# Patient Record
Sex: Female | Born: 1985 | Race: White | Hispanic: No | Marital: Single | Smoking: Never smoker
Health system: Southern US, Community
[De-identification: ages and names within clinical notes are randomized; demographics above are authoritative.]

## PROBLEM LIST (undated history)

## (undated) DIAGNOSIS — K219 Gastro-esophageal reflux disease without esophagitis: Secondary | ICD-10-CM

## (undated) DIAGNOSIS — M549 Dorsalgia, unspecified: Secondary | ICD-10-CM

## (undated) DIAGNOSIS — Z8614 Personal history of Methicillin resistant Staphylococcus aureus infection: Secondary | ICD-10-CM

## (undated) DIAGNOSIS — R233 Spontaneous ecchymoses: Secondary | ICD-10-CM

## (undated) DIAGNOSIS — M797 Fibromyalgia: Secondary | ICD-10-CM

## (undated) DIAGNOSIS — S199XXA Unspecified injury of neck, initial encounter: Secondary | ICD-10-CM

## (undated) DIAGNOSIS — F909 Attention-deficit hyperactivity disorder, unspecified type: Secondary | ICD-10-CM

## (undated) DIAGNOSIS — M5136 Other intervertebral disc degeneration, lumbar region: Secondary | ICD-10-CM

## (undated) DIAGNOSIS — M1990 Unspecified osteoarthritis, unspecified site: Secondary | ICD-10-CM

## (undated) DIAGNOSIS — F32A Depression, unspecified: Secondary | ICD-10-CM

## (undated) DIAGNOSIS — S069XAA Unspecified intracranial injury with loss of consciousness status unknown, initial encounter: Secondary | ICD-10-CM

## (undated) DIAGNOSIS — G8929 Other chronic pain: Secondary | ICD-10-CM

## (undated) DIAGNOSIS — F419 Anxiety disorder, unspecified: Secondary | ICD-10-CM

## (undated) DIAGNOSIS — R238 Other skin changes: Secondary | ICD-10-CM

## (undated) DIAGNOSIS — F4310 Post-traumatic stress disorder, unspecified: Secondary | ICD-10-CM

## (undated) DIAGNOSIS — R519 Headache, unspecified: Secondary | ICD-10-CM

## (undated) DIAGNOSIS — M51369 Other intervertebral disc degeneration, lumbar region without mention of lumbar back pain or lower extremity pain: Secondary | ICD-10-CM

## (undated) DIAGNOSIS — R51 Headache: Secondary | ICD-10-CM

## (undated) DIAGNOSIS — M5126 Other intervertebral disc displacement, lumbar region: Secondary | ICD-10-CM

## (undated) DIAGNOSIS — F19959 Other psychoactive substance use, unspecified with psychoactive substance-induced psychotic disorder, unspecified: Secondary | ICD-10-CM

## (undated) DIAGNOSIS — N809 Endometriosis, unspecified: Secondary | ICD-10-CM

## (undated) DIAGNOSIS — R569 Unspecified convulsions: Secondary | ICD-10-CM

## (undated) DIAGNOSIS — F1510 Other stimulant abuse, uncomplicated: Secondary | ICD-10-CM

## (undated) DIAGNOSIS — F329 Major depressive disorder, single episode, unspecified: Secondary | ICD-10-CM

## (undated) HISTORY — PX: BACK SURGERY: SHX140

## (undated) HISTORY — PX: EXPLORATORY LAPAROTOMY: SUR591

## (undated) HISTORY — PX: ABDOMINAL HYSTERECTOMY: SHX81

## (undated) HISTORY — PX: TONSILLECTOMY: SUR1361

## (undated) HISTORY — PX: APPENDECTOMY: SHX54

---

## 2004-03-04 ENCOUNTER — Emergency Department: Payer: Self-pay | Admitting: General Practice

## 2004-03-05 ENCOUNTER — Ambulatory Visit: Payer: Self-pay

## 2004-03-07 ENCOUNTER — Ambulatory Visit: Payer: Self-pay

## 2004-03-13 ENCOUNTER — Ambulatory Visit: Payer: Self-pay

## 2004-03-25 ENCOUNTER — Ambulatory Visit: Payer: Self-pay

## 2004-03-26 ENCOUNTER — Ambulatory Visit: Payer: Self-pay

## 2004-05-08 ENCOUNTER — Ambulatory Visit: Payer: Self-pay

## 2004-06-24 ENCOUNTER — Emergency Department: Payer: Self-pay | Admitting: Emergency Medicine

## 2004-08-28 ENCOUNTER — Emergency Department: Payer: Self-pay | Admitting: Emergency Medicine

## 2004-09-10 ENCOUNTER — Inpatient Hospital Stay: Payer: Self-pay

## 2004-10-14 ENCOUNTER — Emergency Department: Payer: Self-pay | Admitting: Emergency Medicine

## 2004-10-27 ENCOUNTER — Emergency Department: Payer: Self-pay | Admitting: Emergency Medicine

## 2005-01-11 ENCOUNTER — Observation Stay: Payer: Self-pay

## 2005-02-10 ENCOUNTER — Observation Stay: Payer: Self-pay | Admitting: Obstetrics & Gynecology

## 2005-02-18 ENCOUNTER — Observation Stay: Payer: Self-pay | Admitting: Unknown Physician Specialty

## 2005-03-03 ENCOUNTER — Observation Stay: Payer: Self-pay | Admitting: Unknown Physician Specialty

## 2005-03-03 ENCOUNTER — Ambulatory Visit: Payer: Self-pay | Admitting: Family Medicine

## 2005-03-03 ENCOUNTER — Inpatient Hospital Stay (HOSPITAL_COMMUNITY): Admission: AD | Admit: 2005-03-03 | Discharge: 2005-03-06 | Payer: Self-pay | Admitting: Family Medicine

## 2005-03-07 ENCOUNTER — Observation Stay: Payer: Self-pay | Admitting: Obstetrics & Gynecology

## 2005-03-10 ENCOUNTER — Observation Stay: Payer: Self-pay

## 2005-03-12 ENCOUNTER — Observation Stay: Payer: Self-pay

## 2005-03-21 ENCOUNTER — Observation Stay: Payer: Self-pay

## 2005-03-22 ENCOUNTER — Inpatient Hospital Stay: Payer: Self-pay | Admitting: Unknown Physician Specialty

## 2005-04-03 ENCOUNTER — Observation Stay: Payer: Self-pay | Admitting: Unknown Physician Specialty

## 2005-04-09 ENCOUNTER — Observation Stay: Payer: Self-pay | Admitting: Obstetrics & Gynecology

## 2005-04-10 ENCOUNTER — Inpatient Hospital Stay: Payer: Self-pay | Admitting: Unknown Physician Specialty

## 2005-04-15 ENCOUNTER — Emergency Department: Payer: Self-pay | Admitting: Internal Medicine

## 2005-06-25 ENCOUNTER — Emergency Department: Payer: Self-pay | Admitting: Emergency Medicine

## 2006-05-27 ENCOUNTER — Ambulatory Visit: Payer: Self-pay

## 2006-05-27 ENCOUNTER — Observation Stay: Payer: Self-pay

## 2006-05-31 ENCOUNTER — Observation Stay: Payer: Self-pay | Admitting: Unknown Physician Specialty

## 2006-06-07 ENCOUNTER — Observation Stay: Payer: Self-pay | Admitting: Obstetrics & Gynecology

## 2006-06-20 ENCOUNTER — Observation Stay: Payer: Self-pay | Admitting: Unknown Physician Specialty

## 2006-06-30 ENCOUNTER — Observation Stay: Payer: Self-pay | Admitting: Unknown Physician Specialty

## 2006-07-21 ENCOUNTER — Observation Stay: Payer: Self-pay | Admitting: Unknown Physician Specialty

## 2006-07-23 ENCOUNTER — Observation Stay: Payer: Self-pay | Admitting: Obstetrics & Gynecology

## 2006-07-24 ENCOUNTER — Observation Stay: Payer: Self-pay

## 2006-08-01 ENCOUNTER — Inpatient Hospital Stay: Payer: Self-pay

## 2007-05-20 ENCOUNTER — Emergency Department: Payer: Self-pay | Admitting: Emergency Medicine

## 2007-07-11 ENCOUNTER — Emergency Department: Payer: Self-pay | Admitting: Emergency Medicine

## 2007-07-14 ENCOUNTER — Ambulatory Visit: Payer: Self-pay

## 2007-07-20 ENCOUNTER — Ambulatory Visit: Payer: Self-pay

## 2007-09-21 ENCOUNTER — Emergency Department: Payer: Self-pay | Admitting: Emergency Medicine

## 2007-10-10 ENCOUNTER — Inpatient Hospital Stay: Payer: Self-pay | Admitting: Unknown Physician Specialty

## 2007-10-10 ENCOUNTER — Other Ambulatory Visit: Payer: Self-pay

## 2007-12-28 ENCOUNTER — Emergency Department: Payer: Self-pay | Admitting: Emergency Medicine

## 2008-03-27 ENCOUNTER — Ambulatory Visit: Payer: Self-pay

## 2009-03-28 ENCOUNTER — Emergency Department: Payer: Self-pay | Admitting: Emergency Medicine

## 2009-04-08 ENCOUNTER — Emergency Department: Payer: Self-pay | Admitting: Emergency Medicine

## 2009-10-26 ENCOUNTER — Ambulatory Visit: Payer: Self-pay | Admitting: Family Medicine

## 2009-10-30 ENCOUNTER — Ambulatory Visit: Payer: Self-pay | Admitting: Internal Medicine

## 2009-11-21 ENCOUNTER — Emergency Department: Payer: Self-pay | Admitting: Emergency Medicine

## 2009-12-10 ENCOUNTER — Ambulatory Visit: Payer: Self-pay | Admitting: Family Medicine

## 2010-05-13 ENCOUNTER — Inpatient Hospital Stay: Payer: Self-pay | Admitting: Internal Medicine

## 2010-06-22 ENCOUNTER — Emergency Department: Payer: Self-pay | Admitting: Emergency Medicine

## 2010-07-18 ENCOUNTER — Emergency Department: Payer: Self-pay | Admitting: Emergency Medicine

## 2010-08-20 ENCOUNTER — Ambulatory Visit: Payer: Self-pay | Admitting: Internal Medicine

## 2010-08-27 ENCOUNTER — Ambulatory Visit: Payer: Self-pay | Admitting: Internal Medicine

## 2010-09-06 ENCOUNTER — Ambulatory Visit: Payer: Self-pay | Admitting: Internal Medicine

## 2010-11-14 ENCOUNTER — Emergency Department: Payer: Self-pay | Admitting: Emergency Medicine

## 2010-12-19 ENCOUNTER — Emergency Department: Payer: Self-pay | Admitting: Emergency Medicine

## 2010-12-23 ENCOUNTER — Ambulatory Visit: Payer: Self-pay | Admitting: Family Medicine

## 2010-12-23 ENCOUNTER — Ambulatory Visit: Payer: Self-pay | Admitting: Internal Medicine

## 2010-12-29 ENCOUNTER — Ambulatory Visit: Payer: Self-pay | Admitting: Internal Medicine

## 2011-01-13 ENCOUNTER — Ambulatory Visit: Payer: Self-pay | Admitting: Family Medicine

## 2011-02-05 ENCOUNTER — Other Ambulatory Visit: Payer: Self-pay | Admitting: Pain Medicine

## 2011-02-05 ENCOUNTER — Ambulatory Visit: Payer: Self-pay | Admitting: Pain Medicine

## 2011-02-06 ENCOUNTER — Ambulatory Visit: Payer: Self-pay | Admitting: Pain Medicine

## 2011-02-06 ENCOUNTER — Emergency Department: Payer: Self-pay | Admitting: Emergency Medicine

## 2011-02-10 ENCOUNTER — Ambulatory Visit: Payer: Self-pay | Admitting: Pain Medicine

## 2011-04-09 ENCOUNTER — Emergency Department: Payer: Self-pay | Admitting: Unknown Physician Specialty

## 2011-08-01 ENCOUNTER — Emergency Department: Payer: Self-pay | Admitting: Emergency Medicine

## 2011-08-13 ENCOUNTER — Ambulatory Visit: Payer: Self-pay | Admitting: Unknown Physician Specialty

## 2011-09-26 ENCOUNTER — Emergency Department: Payer: Self-pay | Admitting: Emergency Medicine

## 2011-09-26 LAB — COMPREHENSIVE METABOLIC PANEL
Albumin: 4.4 g/dL (ref 3.4–5.0)
Alkaline Phosphatase: 54 U/L (ref 50–136)
Anion Gap: 8 (ref 7–16)
BUN: 11 mg/dL (ref 7–18)
Bilirubin,Total: 0.9 mg/dL (ref 0.2–1.0)
Calcium, Total: 8.8 mg/dL (ref 8.5–10.1)
Chloride: 106 mmol/L (ref 98–107)
Co2: 26 mmol/L (ref 21–32)
Creatinine: 0.77 mg/dL (ref 0.60–1.30)
EGFR (African American): >60
EGFR (Non-African Amer.): >60
Glucose: 137 mg/dL — ABNORMAL HIGH (ref 65–99)
Osmolality: 281 (ref 275–301)
Potassium: 3.4 mmol/L — ABNORMAL LOW (ref 3.5–5.1)
SGOT(AST): 18 U/L (ref 15–37)
SGPT (ALT): 13 U/L
Sodium: 140 mmol/L (ref 136–145)
Total Protein: 7.7 g/dL (ref 6.4–8.2)

## 2011-09-26 LAB — CBC
HCT: 41.5 % (ref 35.0–47.0)
HGB: 14.1 g/dL (ref 12.0–16.0)
MCH: 30.6 pg (ref 26.0–34.0)
MCHC: 33.9 g/dL (ref 32.0–36.0)
MCV: 90 fL (ref 80–100)
Platelet: 212 10*3/uL (ref 150–440)
RBC: 4.6 10*6/uL (ref 3.80–5.20)
RDW: 12.7 % (ref 11.5–14.5)
WBC: 5.4 10*3/uL (ref 3.6–11.0)

## 2011-09-26 LAB — URINALYSIS, COMPLETE
Bacteria: NONE SEEN
Bilirubin,UR: NEGATIVE
Blood: NEGATIVE
Glucose,UR: NEGATIVE mg/dL (ref 0–75)
Ketone: NEGATIVE
Nitrite: NEGATIVE
Ph: 6 (ref 4.5–8.0)
Protein: NEGATIVE
RBC,UR: 1 /HPF (ref 0–5)
Specific Gravity: 1.008 (ref 1.003–1.030)
Squamous Epithelial: 18
WBC UR: 8 /HPF (ref 0–5)

## 2011-09-26 LAB — DRUG SCREEN, URINE
Amphetamines, Ur Screen: NEGATIVE (ref ?–1000)
Barbiturates, Ur Screen: NEGATIVE (ref ?–200)
Benzodiazepine, Ur Scrn: POSITIVE (ref ?–200)
Cannabinoid 50 Ng, Ur ~~LOC~~: NEGATIVE (ref ?–50)
Cocaine Metabolite,Ur ~~LOC~~: NEGATIVE (ref ?–300)
MDMA (Ecstasy)Ur Screen: NEGATIVE (ref ?–500)
Methadone, Ur Screen: NEGATIVE (ref ?–300)
Opiate, Ur Screen: POSITIVE (ref ?–300)
Phencyclidine (PCP) Ur S: NEGATIVE (ref ?–25)
Tricyclic, Ur Screen: NEGATIVE (ref ?–1000)

## 2011-09-26 LAB — ETHANOL
Ethanol %: <0.003 % (ref 0.000–0.080)
Ethanol: <3 mg/dL

## 2011-09-26 LAB — TSH: Thyroid Stimulating Horm: 0.571 u[IU]/mL

## 2011-09-26 LAB — SALICYLATE LEVEL: Salicylates, Serum: <1.7 mg/dL

## 2011-09-26 LAB — ACETAMINOPHEN LEVEL: Acetaminophen: <2 ug/mL

## 2011-09-29 ENCOUNTER — Ambulatory Visit: Payer: Self-pay

## 2011-10-19 ENCOUNTER — Emergency Department: Payer: Self-pay | Admitting: Emergency Medicine

## 2011-10-22 ENCOUNTER — Ambulatory Visit: Payer: Self-pay | Admitting: Family Medicine

## 2011-11-06 ENCOUNTER — Ambulatory Visit: Payer: Self-pay | Admitting: Unknown Physician Specialty

## 2011-12-07 ENCOUNTER — Emergency Department: Payer: Self-pay | Admitting: Emergency Medicine

## 2012-01-01 ENCOUNTER — Emergency Department: Payer: Self-pay | Admitting: Emergency Medicine

## 2012-01-11 ENCOUNTER — Emergency Department: Payer: Self-pay | Admitting: Emergency Medicine

## 2012-01-27 ENCOUNTER — Ambulatory Visit: Payer: Self-pay | Admitting: General Surgery

## 2012-02-07 ENCOUNTER — Ambulatory Visit: Payer: Self-pay | Admitting: Internal Medicine

## 2012-02-12 ENCOUNTER — Ambulatory Visit: Payer: Self-pay | Admitting: General Surgery

## 2012-02-12 LAB — CBC
HCT: 37.3 % (ref 35.0–47.0)
HGB: 12.7 g/dL (ref 12.0–16.0)
MCH: 31.1 pg (ref 26.0–34.0)
MCHC: 34.1 g/dL (ref 32.0–36.0)
MCV: 91 fL (ref 80–100)
Platelet: 232 10*3/uL (ref 150–440)
RBC: 4.09 10*6/uL (ref 3.80–5.20)
RDW: 12.5 % (ref 11.5–14.5)
WBC: 3.5 10*3/uL — ABNORMAL LOW (ref 3.6–11.0)

## 2012-02-19 ENCOUNTER — Ambulatory Visit: Payer: Self-pay | Admitting: General Surgery

## 2012-02-20 LAB — URINALYSIS, COMPLETE
Bacteria: NONE SEEN
Bilirubin,UR: NEGATIVE
Blood: NEGATIVE
Glucose,UR: NEGATIVE mg/dL (ref 0–75)
Ketone: NEGATIVE
Leukocyte Esterase: NEGATIVE
Nitrite: NEGATIVE
Ph: 7 (ref 4.5–8.0)
Protein: NEGATIVE
RBC,UR: NONE SEEN /HPF (ref 0–5)
Specific Gravity: 1.011 (ref 1.003–1.030)
Squamous Epithelial: 1
WBC UR: 5 /HPF (ref 0–5)

## 2012-02-20 LAB — PATHOLOGY REPORT

## 2012-03-22 ENCOUNTER — Ambulatory Visit: Payer: Self-pay | Admitting: Otolaryngology

## 2012-10-06 ENCOUNTER — Ambulatory Visit: Payer: Self-pay | Admitting: Physical Medicine and Rehabilitation

## 2013-03-04 DIAGNOSIS — T7421XA Adult sexual abuse, confirmed, initial encounter: Secondary | ICD-10-CM | POA: Insufficient documentation

## 2013-03-28 LAB — CBC
HCT: 36.7 % (ref 35.0–47.0)
HGB: 12.8 g/dL (ref 12.0–16.0)
MCH: 31.3 pg (ref 26.0–34.0)
MCHC: 34.9 g/dL (ref 32.0–36.0)
MCV: 90 fL (ref 80–100)
Platelet: 212 10*3/uL (ref 150–440)
RBC: 4.09 10*6/uL (ref 3.80–5.20)
RDW: 12.5 % (ref 11.5–14.5)
WBC: 6.4 10*3/uL (ref 3.6–11.0)

## 2013-03-28 LAB — COMPREHENSIVE METABOLIC PANEL
Albumin: 4 g/dL (ref 3.4–5.0)
Alkaline Phosphatase: 68 U/L (ref 50–136)
Anion Gap: 4 — ABNORMAL LOW (ref 7–16)
BUN: 11 mg/dL (ref 7–18)
Bilirubin,Total: 0.4 mg/dL (ref 0.2–1.0)
Calcium, Total: 8.9 mg/dL (ref 8.5–10.1)
Chloride: 105 mmol/L (ref 98–107)
Co2: 29 mmol/L (ref 21–32)
Creatinine: 0.77 mg/dL (ref 0.60–1.30)
EGFR (African American): >60
EGFR (Non-African Amer.): >60
Glucose: 96 mg/dL (ref 65–99)
Osmolality: 275 (ref 275–301)
Potassium: 2.8 mmol/L — ABNORMAL LOW (ref 3.5–5.1)
SGOT(AST): 26 U/L (ref 15–37)
SGPT (ALT): 18 U/L (ref 12–78)
Sodium: 138 mmol/L (ref 136–145)
Total Protein: 7.5 g/dL (ref 6.4–8.2)

## 2013-03-29 ENCOUNTER — Inpatient Hospital Stay: Payer: Self-pay | Admitting: Internal Medicine

## 2013-03-29 LAB — POTASSIUM: Potassium: 4.3 mmol/L (ref 3.5–5.1)

## 2013-06-28 ENCOUNTER — Emergency Department: Payer: Self-pay | Admitting: Emergency Medicine

## 2013-11-29 DIAGNOSIS — J309 Allergic rhinitis, unspecified: Secondary | ICD-10-CM | POA: Insufficient documentation

## 2014-06-08 DIAGNOSIS — M7551 Bursitis of right shoulder: Secondary | ICD-10-CM | POA: Insufficient documentation

## 2014-06-09 ENCOUNTER — Ambulatory Visit: Payer: Self-pay | Admitting: Physician Assistant

## 2014-06-13 ENCOUNTER — Ambulatory Visit: Payer: Self-pay | Admitting: Internal Medicine

## 2014-08-15 DIAGNOSIS — E8940 Asymptomatic postprocedural ovarian failure: Secondary | ICD-10-CM | POA: Insufficient documentation

## 2014-08-15 DIAGNOSIS — F4312 Post-traumatic stress disorder, chronic: Secondary | ICD-10-CM | POA: Insufficient documentation

## 2014-08-15 DIAGNOSIS — F900 Attention-deficit hyperactivity disorder, predominantly inattentive type: Secondary | ICD-10-CM

## 2014-08-29 DIAGNOSIS — M545 Low back pain, unspecified: Secondary | ICD-10-CM

## 2014-09-19 NOTE — Op Note (Signed)
PATIENT NAME:  Summer Buckley, Summer Buckley MR#:  [id number] DATE OF BIRTH:  12/18/1985  DATE OF PROCEDURE:  02/19/2012  PREOPERATIVE DIAGNOSES: Chronic pelvic pain, endometriosis, abnormal vaginal bleeding.   POSTOPERATIVE DIAGNOSES: Chronic pelvic pain, endometriosis, abnormal vaginal bleeding.   PROCEDURE PERFORMED: Operative laparoscopy with bilateral salpingo-oophorectomy, trachelectomy (removal of cervix).   SURGEON: Glean Salen, M.D.   ASSISTANT: Maeola Sarah, MD  ANESTHESIA: General.   ESTIMATED BLOOD LOSS: Minimal.   COMPLICATIONS: None.   FINDINGS: Endometriosis in the pelvis and ovarian fossa. No cervical lesions. Minimal adhesive disease.   DISPOSITION: To recovery in stable condition.   TECHNIQUE: The patient is prepped and draped in the usual sterile fashion, after adequate anesthesia is obtained, in the dorsal lithotomy position. Foley catheter is inserted and a sponge stick is placed per vagina for manipulation purposes.   Attention is then turned to the abdomen where a Veress needle is inserted through a 5 mm infraumbilical incision after Marcaine is used to anesthetize the skin. Veress needle placement is confirmed using the hanging drop technique and then the abdomen is insufflated with CO2 gas. A 5 mm trocar is then inserted under direct visualization with the laparoscope with no injuries or bleeding noted. The patient is placed in Trendelenburg positioning and the above-mentioned findings are visualized.   A 5 mm trocar is placed in the left lower quadrant and an 11 mm trocar is placed in the right lower quadrant lateral to the inferior epigastric blood vessels with no injuries or bleeding noted. The right adnexa is then identified and grasped and careful dissection as well as coagulation of the pedicle is performed. Ureters are observed to be out of harm's way. 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 It is placed in an Endopouch and removed and sent to pathology for further review. The left fallopian tube and ovary is also grasped and carefully dissected along the infundibulopelvic blood vessels and their ligaments for complete amputation without injury to ureter or bleeding. The ovary and fallopian tube is then removed through an Endopouch and sent to pathology for further review.   Pelvic cavity is irrigated with aspiration of all fluid and hemostasis is assured.   Attention is turned to the vagina for the next part of the case. A speculum is placed and the cervix is grasped with a double-tooth tenaculum. The circumference of the cervix is infiltrated with 1% lidocaine with epinephrine and then incised using Bovie electrocautery in a circumferential fashion. The posterior peritoneum is carefully dissected until penetration of the peritoneal cavity and a long-weighted speculum is placed. The uterosacral ligaments are clamped, transected, and suture ligated, and then sutured to the vaginal cuff. Dissection is carried out anteriorly as well and the ligaments attaching the cervix to the peritoneal surfaces are carefully clamped, transected, and suture ligated until complete amputation of the cervix is performed. It is sent to pathology for further review. The uterosacral ligaments are then plicated using an Ethibond suture and the peritoneum is closed with a 0 Vicryl suture in a pursestring fashion. Excellent hemostasis is noted. The vaginal mucosa is closed with a 2-0 Vicryl suture in a running locking fashion. The vaginal cavity is irrigated with saline and hemostasis is assured. A packing sponge is placed. It is coated in bacitracin ointment. The Foley catheter is removed.   Attention is returned to the abdomen and the laparoscope is replaced.  Excellent hemostasis is noted. The right lower quadrant fascia is closed with a fascial closure  device using Vicryl suture. Trocars are removed, gas is expelled, and skin is closed with Dermabond. The patient goes to the         recovery room in stable condition having tolerated the procedure well. All sponge, instrument, and needle counts are correct.  ____________________________ R. Barnett Applebaum, MD rph:slb D: 02/19/2012 14:34:51 ET T: 02/19/2012 17:27:08 ET JOB#: [id number]  cc: Glean Salen, MD, <Dictator> Gae Dry MD ELECTRONICALLY SIGNED 02/19/2012 17:58

## 2014-09-22 NOTE — H&P (Signed)
PATIENT NAME:  Summer Buckley, Summer Buckley MR#:  [id number] DATE OF BIRTH:  1986-04-17  DATE OF ADMISSION:  03/29/2013  REFERRING PHYSICIAN: Dr. Kerman Passey.   PRIMARY CARE PHYSICIAN: Dr. Dema Severin.   CHIEF COMPLAINT: Shortness of breath.   HISTORY OF PRESENT ILLNESS: This is a 29 year old Caucasian female with past medical history of bipolar, depression. She has also a history of "vocal cord swelling." Presenting with shortness of breath. She has a 2 day history of sore throat with nonproductive cough. She has been having shortness of breath for 1 day's duration with associated inspiratory stridor greater than 12 hours. She denies any fevers or chills. Here in the Emergency Department, she was given racemic epi and Decadron with some improvement of her symptoms. Per her, she has had similar presentations in the past which required intubation. She states that she has followed with an ENT as an outpatient as well. Currently, she is complaining of sore throat.   REVIEW OF SYSTEMS:  CONSTITUTIONAL: Denies fevers, chills, fatigue, weakness. Positive for pain in her throat.  EYES: Denies blurred vision, double vision, eye pain.  ENT: Denies ear pain, hearing loss, epistaxis, postnasal drip or sinus pain. Denies difficulty swallowing.  RESPIRATORY: Positive for a nonproductive cough. Positive for wheeze and shortness of breath.  CARDIOVASCULAR: Denies chest pain, palpitations or edema.  GASTROINTESTINAL: Denies nausea, vomiting, diarrhea, abdominal pain.  GENITOURINARY: Denies dysuria, hematuria.  ENDOCRINE: Denies nocturia or thyroid problems.  HEMATOLOGIC AND LYMPHATIC: Denies easy bruising or bleeding.  SKIN: Denies rashes or lesions.  MUSCULOSKELETAL: Denies pain in her neck, back, shoulders, knees or hips.  NEUROLOGIC: Denies paralysis or paresthesias.  PSYCHIATRIC: Denies anxiety or depressive symptoms.   Otherwise, full review of systems performed by me is negative.   PAST MEDICAL HISTORY: Bipolar  disorder not otherwise specified, depression.   PAST SURGICAL HISTORY: Partial hysterectomy, tonsillectomy, appendectomy and a C-section.   SOCIAL HISTORY: Positive for tobacco abuse, 1 pack daily. Denies any alcohol or drug usage.   FAMILY HISTORY: Positive for hypertension.   ALLERGIES: No known drug allergies.   HOME MEDICATIONS: Adderall 10 mg p.o. b.i.d., diclofenac 1% topical gel applied to affected area 4 times daily, Equetro 200 mg p.o. b.i.d., ibuprofen 800 mg p.o. q.6 hours as needed for pain, Klonopin 1 mg p.o. t.i.d., Percocet 10/325 mg p.o. 4 to 6 hours as needed for pain.   PHYSICAL EXAMINATION:  VITAL SIGNS: Temperature 98.5, heart rate 95, respirations 28, blood pressure 113/68, saturating 100% on room air. Weight 53.1 kg, BMI 19.5.  GENERAL: Well-nourished, well-developed, Caucasian female who is in minimal to moderate respiratory distress.  HEAD: Normocephalic, atraumatic.  EYES: Pupils equal, round, reactive to light. Extraocular muscles intact. No scleral icterus.  MOUTH: Moist mucosal membranes. Dentition intact. No abscesses noted.  EARS, NOSE, THROAT: Throat clear without exudate. No external lesions.  NECK: Supple. No thyromegaly. No nodules appreciated. No JVD.  PULMONARY: Clear to auscultation bilaterally; however, inspiratory stridor prominent over the neck. No use of accessory muscles. Good respiratory effort.  CHEST: Nontender to palpation.  CARDIOVASCULAR: S1, S2, regular rate and rhythm. No murmurs, rubs or gallops. No edema. Pedal pulses 2+.  GASTROINTESTINAL: Soft, nontender, nondistended. No masses. Positive bowel sounds. No hepatosplenomegaly.  MUSCULOSKELETAL: No swelling, clubbing or edema. Range of motion full in all extremities.  NEUROLOGIC: Cranial nerves II through XII intact. No gross neurological deficits. Sensation intact. Reflexes intact.  SKIN: No ulcerations, lesions, rashes or cyanosis. Skin warm and dry. Turgor is intact.  PSYCHIATRIC: Mood  and affect within normal limits. Awake and oriented x 3. Insight and judgment intact.   LABORATORY DATA: EKG: Normal sinus rhythm, heart rate of 99.   Chest x-ray: No acute cardiopulmonary process.   CT of the neck performed which revealed no lymphadenopathy. No tracheal narrowing, and the larynx and pharynx are within normal limits. Essentially normal CAT scan.   Sodium 138, potassium 2.8, chloride 105, bicarb 29, BUN 11, creatinine 0.77, glucose 96. LFTs within normal limits. WBC 6.4, hemoglobin 12.8, platelets of 212.   ASSESSMENT AND PLAN: A 29 year old Caucasian female with history of bipolar, depression, as well as of vocal cord swelling, presenting with shortness of breath.  1. Inspiratory stridor: CT neck without abnormality. No evidence of epiglottitis. Chest x-ray within normal limits. This is possible laryngotracheitis, though highly unlikely given age and lack of abnormality on CT,  versus vocal cord dysfunction worsened by anxiety. She has been given Decadron and racemic epinephrine with some improvement of symptoms. Will redose racemic epinephrine x 1. May need to redose of Decadron if symptoms continue, though this likely will not be beneficial. Supplemental oxygen to keep oxygen saturation greater than 92%. Will consult ear, nose and throat, Dr. Kathyrn Sheriff. He is already aware of the case.  2. Hypokalemia: Will replace with a goal potassium of 4 to 5.  3. Bipolar, depression: Continue carbamazepine and Klonopin.   The patient is FULL CODE.   TIME SPENT: 45 minutes.    ____________________________ Aaron Mose. Jazzmon Prindle, MD dkh:gb D: 03/29/2013 01:34:22 ET T: 03/29/2013 04:08:32 ET JOB#: [id number]  cc: Aaron Mose. Shaine Newmark, MD, <Dictator> Damonie Furney Woodfin Ganja MD ELECTRONICALLY SIGNED 03/29/2013 20:24

## 2014-09-22 NOTE — Consult Note (Signed)
PATIENT NAME:  Summer Buckley, Summer Buckley MR#:  [id number] DATE OF BIRTH:  1985/07/30  DATE OF CONSULTATION:  03/29/2013  PRIMARY CARE PHYSICIAN: Dr. Dema Severin.  CONSULTING PHYSICIAN: Huey Romans, M.D.   REASON FOR CONSULTATION: Inspiratory stridor.   HISTORY OF PRESENT ILLNESS: The patient is a 29 year old white female with a past medical history of bipolar depression. She has had a history of stridor in the past.  Approximately three years ago she presented with shortness of breath and inspiratory stridor. 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 She was watched in the hospital for several days in the ICU, seemed to slowly settle down. She was watched a couple of days further in the hospital and within a week she was discharged home and seemed to do well. She has had very rare intermittent problems of feeling like her throat closing off, but until just recently she has had a two day history of sore throat and nonproductive cough and seems like she is getting tighter again. She presented to Emergency Room in the middle of the night last night with acute inspiratory stridor again.   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   PAST SURGICAL HISTORY: Partial hysterectomy, tonsillectomy, appendectomy and C-section.   SOCIAL HISTORY: She smokes a pack a day. Denies any alcohol or drug use.   FAMILY HISTORY: Positive for hypertension.   CURRENT MEDICATIONS: The hospital has reviewed with her. She is not on anything for her lungs or allergies. .   ALLERGIES: She has no known drug allergies.   REVIEW OF SYSTEMS: Unable to take because of her being acutely short of breath.   PHYSICAL EXAMINATION: The patient is sitting up in the ICU, leaning forward a little bit. You can hear an inspiratory stridor. She has a face mask on with oxygen currently. She has had racemic epinephrine with no benefit. Her nose  looks open anteriorly. Oropharynx does not show any redness or signs of problems. Her neck negative for nodes or masses.   Flexible laryngoscopy is done at the bedside, passed through the right nostril. This is dictated in detail elsewhere. This shows a healthy nose, nasopharynx. There is no sign of any acute redness or inflammation. The hypopharynx and larynx did not show acute redness or infection. Vocal cords are pearly white. She can open her cords very widely at times, but then with inspiration she tends to pull them towards the midline and you hear the vocalization and stridor, but then she will open them widely and take gasps in between that allow her to get some air in in between. There is no sign of redness in the subglottic area or anywhere in the hypopharynx.   IMPRESSION: The patient has a paradoxical closure of the vocal cords but does not have paralysis of her cords. She can open them widely and thus protecting her from complete desaturation. As she breathes slower and easier through nose then she does not have the stridor and closure as she is not pulling so hard. We are going to get her some Afrin right now to help open up her nose, keep it open, so she can keep breathing through her nose and start her on some Nasacort to stabilize the mucous membranes. We have her on some Ativan to help control the anxiety, because the more anxious she gets the harder she tries to breathe in, the more it tends to close  her cords. If she should pass out, of course, would relax and should actually breathe a little bit better. She could easily be intubated as there is no laryngeal spasm here, but more just closure with forced inspiration. Using racemic epinephrine has no significant effects and she does not have any swelling of the larynx at all. I would consider continuing using some Decadron in case there is some inflammation secondary to a viral flare-up, and we will continue to watch her,  but as her symptoms  subside she can be moved back to the floor and hopefully this will resolve on its own in the next several days or so.  ____________________________ Huey Romans, MD phj:sg D: 03/29/2013 13:18:03 ET T: 03/29/2013 13:32:30 ET JOB#: [id number]  cc: Huey Romans, MD, <Dictator> Huey Romans MD ELECTRONICALLY SIGNED 03/31/2013 7:47

## 2014-09-22 NOTE — Op Note (Signed)
PATIENT NAME:  Summer Buckley, Summer Buckley MR#:  [id number] DATE OF BIRTH:  08-Sep-1985  DATE OF PROCEDURE:  03/29/2013  PREOPERATIVE DIAGNOSIS: Inspiratory stridor.   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   ANESTHESIA: None.  SURGEON: Huey Romans.   PROCEDURE: Flexible laryngoscopy.   COMPLICATIONS: None.   DESCRIPTION OF PROCEDURE: The patient was seen in the ICU. She was breathing rapidly with inspiratory stridor and quite anxious. The flexible scope was placed through the right nostril as this was more open. She has got a septal spur to her left side. The nose is totally clear. No sign of infection. The nasopharynx is clear as well. The hypopharynx shows base of tongue clear. The epiglottis looks normal. The vocal cords are pearly white. She opens her cords widely at times, but then when she tries to inspire she tends to pull them together and you can see them vocalizing to give her the stridor. She would then open them widely again for just a brief second and then she will close again some as she is trying to breathe in and out. When I get her to  slow down and breathe, they do not pull together nearly as tightly. There is no sign of lesions in the larynx. The subglottic space is totally clear. There is no inflammation here whatsoever. No swelling of the hypopharynx anywhere or signs of reflux or other redness or irritation. No evidence for infection anywhere. The patient tolerated procedure well. This was done at the bedside. There were no operative complications.   ____________________________ Huey Romans, MD phj:aw D: 03/29/2013 13:11:57 ET T: 03/29/2013 13:43:42 ET JOB#: [id number]  cc: Huey Romans, MD, <Dictator> Huey Romans MD ELECTRONICALLY SIGNED 03/31/2013 7:48

## 2014-09-22 NOTE — Discharge Summary (Signed)
PATIENT NAME:  Summer Buckley, Summer Buckley MR#:  [id number] DATE OF BIRTH:  January 20, 1986  DATE OF ADMISSION:  03/29/2013 DATE OF DISCHARGE:  03/31/2013  PRIMARY CARE PHYSICIAN:  At [facility].   DISCHARGE DIAGNOSES:  1.  Stridor secondary to viral croup and anxiety.  2.  Anxiety.  3.  Migraines.  4.  Hypokalemia.  5.  Dehydration.   IMAGING STUDIES:  Include:  1.  A CT scan of the neck which was normal.  2.  A chest x-ray showed no acute abnormalities.   CONSULTS:  Dr. Kathyrn Sheriff of ENT.   PROCEDURES:  Flexible laryngoscope, which was normal.   ADMITTING HISTORY AND PHYSICAL:  Please see detailed H and P dictated by Dr. Lavetta Nielsen. In brief, a 29 year old Caucasian female patient with prior history of stridor secondary to viral tracheobronchitis presented to the hospital complaining of similar complaints with cough, dysphagia, sore throat and stridor. The patient initially was given racemic epinephrine and Decadron, admitted to the hospitalist service for monitoring. The patient, on day 2, deteriorated again with similar symptoms, was started on Solu-Medrol scheduled along with some Ativan and racemic epinephrine with which her symptoms resolved. She was moved to CCU. The patient was seen by ENT, Dr. Kathyrn Sheriff, had a flexible laryngoscopy, which showed no significant findings. The patient was continued on the steroids, anxiety medication.   On the day of discharge, the patient is back to baseline, is ambulating well, and with exertion, no shortness of breath. No stridor. No wheezing on examination. Will be discharged back home to follow up with primary care physician in 1 to 2 weeks and with Dr. Kathyrn Sheriff as needed. She will be on prednisone for 6 more days.   DISCHARGE MEDICATIONS:  Include:  1.  Diclofenac 1% topical affected area 4 times a day.  2.  Adderall 10 mg oral 2 times a day.  3.  Klonopin 1 mg oral 3 times a day.  4.  Ibuprofen 800 mg oral every 6 hours as needed for pain.  5.  Percocet  10/325, 1 tablet oral every 4 to 6 hours as needed for pain.  6.  Equetro 200 mg oral extended release 2 times a day.  7.  Benzonatate 100 mg oral every 6 hours as needed for cough.  8.  Prednisone 60 mg tapered x 10 mg every day.   DISCHARGE INSTRUCTIONS:  Regular diet. Activity as tolerated. Followup primary care physician and Dr. Kathyrn Sheriff.   TIME SPENT ON DAY OF DISCHARGE IN DISCHARGE ACTIVITY:  Was 35 minutes.   ____________________________ Leia Alf Montrae Braithwaite, MD srs:jm D: 03/31/2013 13:54:37 ET T: 03/31/2013 14:49:50 ET JOB#: [id number]  cc: Alveta Heimlich R. Luis Nickles, MD, <Dictator> Neita Carp MD ELECTRONICALLY SIGNED 04/01/2013 13:01

## 2014-11-07 DIAGNOSIS — M47817 Spondylosis without myelopathy or radiculopathy, lumbosacral region: Secondary | ICD-10-CM | POA: Insufficient documentation

## 2014-12-12 DIAGNOSIS — S22069A Unspecified fracture of T7-T8 vertebra, initial encounter for closed fracture: Secondary | ICD-10-CM | POA: Insufficient documentation

## 2014-12-18 DIAGNOSIS — M533 Sacrococcygeal disorders, not elsewhere classified: Secondary | ICD-10-CM | POA: Insufficient documentation

## 2014-12-19 ENCOUNTER — Encounter: Payer: Self-pay | Admitting: *Deleted

## 2014-12-19 ENCOUNTER — Emergency Department: Payer: Medicaid Other

## 2014-12-19 ENCOUNTER — Emergency Department
Admission: EM | Admit: 2014-12-19 | Discharge: 2014-12-19 | Disposition: A | Discharge status: Home / Self Care | Payer: Medicaid Other | Attending: Emergency Medicine | Admitting: Emergency Medicine

## 2014-12-19 DIAGNOSIS — X58XXXA Exposure to other specified factors, initial encounter: Secondary | ICD-10-CM | POA: Insufficient documentation

## 2014-12-19 DIAGNOSIS — S22000A Wedge compression fracture of unspecified thoracic vertebra, initial encounter for closed fracture: Secondary | ICD-10-CM

## 2014-12-19 DIAGNOSIS — S3982XA Other specified injuries of lower back, initial encounter: Secondary | ICD-10-CM | POA: Diagnosis not present

## 2014-12-19 DIAGNOSIS — Y998 Other external cause status: Secondary | ICD-10-CM | POA: Diagnosis not present

## 2014-12-19 DIAGNOSIS — S3992XA Unspecified injury of lower back, initial encounter: Secondary | ICD-10-CM | POA: Diagnosis present

## 2014-12-19 DIAGNOSIS — Y9289 Other specified places as the place of occurrence of the external cause: Secondary | ICD-10-CM | POA: Diagnosis not present

## 2014-12-19 DIAGNOSIS — Y9389 Activity, other specified: Secondary | ICD-10-CM | POA: Diagnosis not present

## 2014-12-19 HISTORY — DX: Attention-deficit hyperactivity disorder, unspecified type: F90.9

## 2014-12-19 HISTORY — DX: Other chronic pain: G89.29

## 2014-12-19 HISTORY — DX: Post-traumatic stress disorder, unspecified: F43.10

## 2014-12-19 HISTORY — DX: Dorsalgia, unspecified: M54.9

## 2014-12-19 HISTORY — DX: Anxiety disorder, unspecified: F41.9

## 2014-12-19 HISTORY — DX: Endometriosis, unspecified: N80.9

## 2014-12-19 MED ORDER — KETOROLAC TROMETHAMINE 60 MG/2ML IM SOLN
60.0000 mg | Freq: Once | INTRAMUSCULAR | Status: AC
  Administered 2014-12-19: 60 mg via INTRAMUSCULAR
  Filled 2014-12-19: qty 2

## 2014-12-19 MED ORDER — OXYCODONE-ACETAMINOPHEN 5-325 MG PO TABS: 1.0000 | ORAL_TABLET | ORAL | Status: DC | PRN

## 2014-12-19 NOTE — ED Notes (Signed)
Pt to triage via wheelchair.  Pt has mid back pain.  States pain radiates into right leg.  Pt states she was lifting a window 1 month ago, pain has increased.  Pt tearful in triage.

## 2014-12-19 NOTE — Discharge Instructions (Signed)
Back, Compression Fracture °A compression fracture happens when a force is put upon the length of your spine. Slipping and falling on your bottom are examples of such a force. When this happens, sometimes the force is great enough to compress the building blocks (vertebral bodies) of your spine. Although this causes a lot of pain, this can usually be treated at home, unless your caregiver feels hospitalization is needed for pain control. °Your backbone (spinal column) is made up of 24 main vertebral bodies in addition to the sacrum and coccyx (see illustration). These are held together by tough fibrous tissues (ligaments) and by support of your muscles. Nerve roots pass through the openings between the vertebrae. A sudden wrenching move, injury, or a fall may cause a compression fracture of one of the vertebral bodies. This may result in back pain or spread of pain into the belly (abdomen), the buttocks, and down the leg into the foot. Pain may also be created by muscle spasm alone. °Large studies have been undertaken to determine the best possible course of action to help your back following injury and also to prevent future problems. The recommendations are as follows. °FOLLOWING A COMPRESSION FRACTURE: °Do the following only if advised by your caregiver.  °· If a back brace has been suggested or provided, wear it as directed. °· Do not stop wearing the back brace unless instructed by your caregiver. °· When allowed to return to regular activities, avoid a sedentary lifestyle. Actively exercise. Sporadic weekend binges of tennis, racquetball, or waterskiing may actually aggravate or create problems, especially if you are not in condition for that activity. °· Avoid sports requiring sudden body movements until you are in condition for them. Swimming and walking are safer activities. °· Maintain good posture. °· Avoid obesity. °· If not already done, you should have a DEXA scan. Based on the results, be treated for  osteoporosis. °FOLLOWING ACUTE (SUDDEN) INJURY: °· Only take over-the-counter or prescription medicines for pain, discomfort, or fever as directed by your caregiver. °· Use bed rest for only the most extreme acute episode. Prolonged bed rest may aggravate your condition. Ice used for acute conditions is effective. Use a large plastic bag filled with ice. Wrap it in a towel. This also provides excellent pain relief. This may be continuous. Or use it for 30 minutes every 2 hours during acute phase, then as needed. Heat for 30 minutes prior to activities is helpful. °· As soon as the acute phase (the time when your back is too painful for you to do normal activities) is over, it is important to resume normal activities and work hardening programs. Back injuries can cause potentially marked changes in lifestyle. So it is important to attack these problems aggressively. °· See your caregiver for continued problems. He or she can help or refer you for appropriate exercises, physical therapy, and work hardening if needed. °· If you are given narcotic medications for your condition, for the next 24 hours do not: °¨ Drive. °¨ Operate machinery or power tools. °¨ Sign legal documents. °· Do not drink alcohol, or take sleeping pills or other medications that may interfere with treatment. °If your caregiver has given you a follow-up appointment, it is very important to keep that appointment. Not keeping the appointment could result in a chronic or permanent injury, pain, and disability. If there is any problem keeping the appointment, you must call back to this facility for assistance.  °SEEK IMMEDIATE MEDICAL CARE IF: °· You develop numbness,   tingling, weakness, or problems with the use of your arms or legs. °· You develop severe back pain not relieved with medications. °· You have changes in bowel or bladder control. °· You have increasing pain in any areas of the body. °Document Released: 05/19/2005 Document Revised:  10/03/2013 Document Reviewed: 12/22/2007 °[employer]® Patient Information ©2015 [employer]. This information is not intended to replace advice given to you by your health care provider. Make sure you discuss any questions you have with your health care provider. ° °

## 2014-12-19 NOTE — ED Notes (Signed)
Saw md a month ago for the same and dx with back strain.

## 2014-12-19 NOTE — ED Provider Notes (Signed)
[facility] Emergency Department Provider Note  ____________________________________________  Time seen: Approximately 8:36 PM  I have reviewed the triage vital signs and the nursing notes.   HISTORY  Chief Complaint Back Pain   HPI Summer Buckley is a 29 y.o. female who presents to the emergency room via wheelchair patient complaining of mid back pain. Patient states the pain radiates down into her right leg after lifting a window approximately 1 month ago. Patient is very tearful in the emergency room waiting room.   Past Medical History  Diagnosis Date  . Endometriosis   . PTSD (post-traumatic stress disorder)   . ADHD (attention deficit hyperactivity disorder)   . Anxiety   . Chronic back pain     There are no active problems to display for this patient.   No past surgical history on file.  Current Outpatient Rx  Name  Route  Sig  Dispense  Refill  . oxyCODONE-acetaminophen (ROXICET) 5-325 MG per tablet   Oral   Take 1-2 tablets by mouth every 4 (four) hours as needed for severe pain.   15 tablet   0     Allergies Tape  No family history on file.  Social History History  Substance Use Topics  . Smoking status: Never Smoker   . Smokeless tobacco: Not on file  . Alcohol Use: No    Review of Systems Constitutional: No fever/chills Eyes: No visual changes. ENT: No sore throat. Cardiovascular: Denies chest pain. Respiratory: Denies shortness of breath. Gastrointestinal: No abdominal pain.  No nausea, no vomiting.  No diarrhea.  No constipation. Genitourinary: Negative for dysuria. Musculoskeletal: Negative for back pain. Skin: Negative for rash. Neurological: Negative for headaches, focal weakness or numbness.  10-point ROS otherwise negative.  ____________________________________________   PHYSICAL EXAM:  VITAL SIGNS: ED Triage Vitals  Enc Vitals Group     BP 12/19/14 2012 124/82 mmHg     Pulse Rate 12/19/14  2012 115     Resp 12/19/14 2012 18     Temp 12/19/14 2012 98.3 F (36.8 C)     Temp Source 12/19/14 2012 Oral     SpO2 12/19/14 2012 100 %     Weight 12/19/14 2012 121 lb (54.885 kg)     Height 12/19/14 2012 5\' 5"  (1.651 m)     Head Cir --      Peak Flow --      Pain Score 12/19/14 2015 10     Pain Loc --      Pain Edu? --      Excl. in [place]? --     Constitutional: Alert and oriented. Well appearing and in no acute distress. Eyes: Conjunctivae are normal. PERRL. EOMI. Head: Atraumatic. Nose: No congestion/rhinnorhea. Mouth/Throat: Mucous membranes are moist.  Oropharynx non-erythematous. Neck: No stridor.   Cardiovascular: Normal rate, regular rhythm. Grossly normal heart sounds.  Good peripheral circulation. Respiratory: Normal respiratory effort.  No retractions. Lungs CTAB. Gastrointestinal: Soft and nontender. No distention. No abdominal bruits. No CVA tenderness. Musculoskeletal: No lower extremity tenderness nor edema.  No joint effusions. Neurologic:  Normal speech and language. No gross focal neurologic deficits are appreciated. No gait instability. Skin:  Skin is warm, dry and intact. No rash noted. Psychiatric: Mood and affect are normal. Speech and behavior are normal.  ____________________________________________   LABS (all labs ordered are listed, but only abnormal results are displayed)  Labs Reviewed - No data to display ____________________________________________   RADIOLOGY  Thoracic spine interpreted by radiologist  few by myself negative for acute fracture. ____________________________________________   PROCEDURES  Procedure(s) performed: None  Critical Care performed: No  ____________________________________________   INITIAL IMPRESSION / ASSESSMENT AND PLAN / ED COURSE  Pertinent labs & imaging results that were available during my care of the patient were reviewed by me and considered in my medical decision making (see chart for  details).  Mid back pain secondary to lifting. Continue current prescriptions of Valium 10 mg 3 times a day home. Rx provided for Motrin 800 mg 3 times a day and Percocet 5/325. [place] controlled substance list reviewed prior to prescribing. Patient voices understanding and will return to the ER with any worsening symptoms. She denies any other emergency medical complaints at this time. ____________________________________________   FINAL CLINICAL IMPRESSION(S) / ED DIAGNOSES  Final diagnoses:  Compression of thoracic vertebra, closed, initial encounter      Arlyss Repress, PA-C 12/19/14 2347  Harvest Dark, MD 12/23/14 [phone]

## 2014-12-22 ENCOUNTER — Emergency Department
Admission: EM | Admit: 2014-12-22 | Discharge: 2014-12-22 | Discharge status: Against Medical Advice | Payer: Medicaid Other | Attending: Emergency Medicine | Admitting: Emergency Medicine

## 2014-12-22 ENCOUNTER — Encounter: Payer: Self-pay | Admitting: Emergency Medicine

## 2014-12-22 DIAGNOSIS — G8929 Other chronic pain: Secondary | ICD-10-CM | POA: Insufficient documentation

## 2014-12-22 DIAGNOSIS — Z87828 Personal history of other (healed) physical injury and trauma: Secondary | ICD-10-CM | POA: Diagnosis not present

## 2014-12-22 DIAGNOSIS — M545 Low back pain: Secondary | ICD-10-CM | POA: Insufficient documentation

## 2014-12-22 NOTE — ED Notes (Signed)
Pt states she is tired of waiting.

## 2014-12-22 NOTE — ED Notes (Signed)
Pt presents to ER alert and crying profusely in triage. Pt states she was seen here a week ago and dx with a compression fx. Pt states she has been taking pain meds at home and it isn't helping.

## 2014-12-25 ENCOUNTER — Emergency Department
Admission: EM | Admit: 2014-12-25 | Discharge: 2014-12-25 | Disposition: A | Discharge status: Home / Self Care | Payer: Medicaid Other | Attending: Emergency Medicine | Admitting: Emergency Medicine

## 2014-12-25 ENCOUNTER — Emergency Department: Payer: Medicaid Other

## 2014-12-25 DIAGNOSIS — X58XXXD Exposure to other specified factors, subsequent encounter: Secondary | ICD-10-CM | POA: Diagnosis not present

## 2014-12-25 DIAGNOSIS — IMO0002 Reserved for concepts with insufficient information to code with codable children: Secondary | ICD-10-CM

## 2014-12-25 DIAGNOSIS — S22068D Other fracture of T7-T8 thoracic vertebra, subsequent encounter for fracture with routine healing: Secondary | ICD-10-CM | POA: Diagnosis not present

## 2014-12-25 DIAGNOSIS — Z79899 Other long term (current) drug therapy: Secondary | ICD-10-CM | POA: Insufficient documentation

## 2014-12-25 DIAGNOSIS — M546 Pain in thoracic spine: Secondary | ICD-10-CM

## 2014-12-25 DIAGNOSIS — R202 Paresthesia of skin: Secondary | ICD-10-CM | POA: Diagnosis not present

## 2014-12-25 MED ORDER — LIDOCAINE 5 % EX PTCH
1.0000 | MEDICATED_PATCH | Freq: Once | CUTANEOUS | Status: DC
  Administered 2014-12-25: 1 via TRANSDERMAL
  Filled 2014-12-25: qty 1

## 2014-12-25 MED ORDER — OXYCODONE-ACETAMINOPHEN 5-325 MG PO TABS
1.0000 | ORAL_TABLET | Freq: Once | ORAL | Status: AC
  Filled 2014-12-25: qty 1

## 2014-12-25 MED ORDER — OXYCODONE-ACETAMINOPHEN 5-325 MG PO TABS
ORAL_TABLET | ORAL | Status: AC
  Administered 2014-12-25: 1
  Filled 2014-12-25: qty 1

## 2014-12-25 MED ORDER — OXYCODONE-ACETAMINOPHEN 5-325 MG PO TABS
1.0000 | ORAL_TABLET | Freq: Once | ORAL | Status: AC
  Administered 2014-12-25: 1 via ORAL

## 2014-12-25 MED ORDER — LIDOCAINE 5 % EX PTCH: 1.0000 | MEDICATED_PATCH | CUTANEOUS | Status: DC

## 2014-12-25 MED ORDER — CYCLOBENZAPRINE HCL 10 MG PO TABS
5.0000 mg | ORAL_TABLET | Freq: Once | ORAL | Status: AC
  Administered 2014-12-25: 5 mg via ORAL
  Filled 2014-12-25: qty 1

## 2014-12-25 NOTE — ED Notes (Signed)
Spoke with Dr Dahlia Client; no order for CT at this time; order for Percocet for pain

## 2014-12-25 NOTE — Discharge Instructions (Signed)
Back Pain, Adult Low back pain is very common. About 1 in 5 people have back pain.The cause of low back pain is rarely dangerous. The pain often gets better over time.About half of people with a sudden onset of back pain feel better in just 2 weeks. About 8 in 10 people feel better by 6 weeks.  CAUSES Some common causes of back pain include:  Strain of the muscles or ligaments supporting the spine.  Wear and tear (degeneration) of the spinal discs.  Arthritis.  Direct injury to the back. DIAGNOSIS Most of the time, the direct cause of low back pain is not known.However, back pain can be treated effectively even when the exact cause of the pain is unknown.Answering your caregiver's questions about your overall health and symptoms is one of the most accurate ways to make sure the cause of your pain is not dangerous. If your caregiver needs more information, he or she may order lab work or imaging tests (X-rays or MRIs).However, even if imaging tests show changes in your back, this usually does not require surgery. HOME CARE INSTRUCTIONS For many people, back pain returns.Since low back pain is rarely dangerous, it is often a condition that people can learn to manageon their own.   Remain active. It is stressful on the back to sit or stand in one place. Do not sit, drive, or stand in one place for more than 30 minutes at a time. Take short walks on level surfaces as soon as pain allows.Try to increase the length of time you walk each day.  Do not stay in bed.Resting more than 1 or 2 days can delay your recovery.  Do not avoid exercise or work.Your body is made to move.It is not dangerous to be active, even though your back may hurt.Your back will likely heal faster if you return to being active before your pain is gone.  Pay attention to your body when you bend and lift. Many people have less discomfortwhen lifting if they bend their knees, keep the load close to their bodies,and  avoid twisting. Often, the most comfortable positions are those that put less stress on your recovering back.  Find a comfortable position to sleep. Use a firm mattress and lie on your side with your knees slightly bent. If you lie on your back, put a pillow under your knees.  Only take over-the-counter or prescription medicines as directed by your caregiver. Over-the-counter medicines to reduce pain and inflammation are often the most helpful.Your caregiver may prescribe muscle relaxant drugs.These medicines help dull your pain so you can more quickly return to your normal activities and healthy exercise.  Put ice on the injured area.  Put ice in a plastic bag.  Place a towel between your skin and the bag.  Leave the ice on for 15-20 minutes, 03-04 times a day for the first 2 to 3 days. After that, ice and heat may be alternated to reduce pain and spasms.  Ask your caregiver about trying back exercises and gentle massage. This may be of some benefit.  Avoid feeling anxious or stressed.Stress increases muscle tension and can worsen back pain.It is important to recognize when you are anxious or stressed and learn ways to manage it.Exercise is a great option. SEEK MEDICAL CARE IF:  You have pain that is not relieved with rest or medicine.  You have pain that does not improve in 1 week.  You have new symptoms.  You are generally not feeling well. SEEK   IMMEDIATE MEDICAL CARE IF:   You have pain that radiates from your back into your legs.  You develop new bowel or bladder control problems.  You have unusual weakness or numbness in your arms or legs.  You develop nausea or vomiting.  You develop abdominal pain.  You feel faint. Document Released: 05/19/2005 Document Revised: 11/18/2011 Document Reviewed: 09/20/2013 Exit[employer] Patient Information 2015 [employer]. This information is not intended to replace advice given to you by your health care provider. Make sure you  discuss any questions you have with your health care provider.  

## 2014-12-25 NOTE — ED Notes (Addendum)
Pt says she was seen here this week and told she has a compression fracture of T7; pt presents tonight with pain straight down her back that is causing pain when she breathes; tingling down both legs; pt says pain is worse when she is sitting; pt says she took her last oxycodone a few hours ago; pt has scheduled followup Tuesday at [facility] neuro/spine

## 2014-12-25 NOTE — ED Provider Notes (Signed)
[facility] Emergency Department Provider Note  ____________________________________________  Time seen: Approximately 7:43 AM  I have reviewed the triage vital signs and the nursing notes.   HISTORY  Chief Complaint Back Pain and Numbness    HPI Summer Buckley is a 29 y.o. female who comes into the hospital with back pain. The patient reports that she does have a T7 compression fracture which was diagnosed last week. The patient reports that she feels as though the pain is radiating around to her chest and down her back. She reports that the pain goes into her Botox and has caused her legs to be tingly. The patient reports that her right leg has been tingling for a month in her left leg has recently started. The patient is unable to sit or stand secondary to the pain. The patient hasn't point with neurosurgery on Tuesday but has run out of her medication. She reports that she was given oxycodone for pain but it has not been helping. The patient denies any problems urinating and has not been having difficulty with bowel movements. She has no known trauma but reports putting a large window prior to beginning of the pain. The patient reports that her right leg has been mildly weak for 1 month as well. She came in tonight because of the chest in her pain. She reports it hurts when she takes a deep breath in and she was scared. She said the pain goes around her back towards her chest. Her pain is a 10 out of 10 in intensity.   Past Medical History  Diagnosis Date  . Endometriosis   . PTSD (post-traumatic stress disorder)   . ADHD (attention deficit hyperactivity disorder)   . Anxiety   . Chronic back pain     There are no active problems to display for this patient.   Past Surgical History  Procedure Laterality Date  . Abdominal hysterectomy      Current Outpatient Rx  Name  Route  Sig  Dispense  Refill  . amphetamine-dextroamphetamine (ADDERALL) 20 MG  tablet   Oral   Take 20 mg by mouth 2 (two) times daily.         Marland Kitchen buPROPion (WELLBUTRIN SR) 200 MG 12 hr tablet   Oral   Take 200 mg by mouth every morning.         . diazepam (VALIUM) 5 MG tablet   Oral   Take 5 mg by mouth every 8 (eight) hours as needed for anxiety.         Marland Kitchen ketorolac (TORADOL) 10 MG tablet   Oral   Take 1 tablet by mouth every 6 (six) hours.         Marland Kitchen oxyCODONE-acetaminophen (PERCOCET/ROXICET) 5-325 MG per tablet   Oral   Take 1 tablet by mouth every 6 (six) hours.         . lidocaine (LIDODERM) 5 %   Transdermal   Place 1 patch onto the skin daily. Remove & Discard patch within 12 hours or as directed by MD   5 patch   0     Allergies Tape  No family history on file.  Social History History  Substance Use Topics  . Smoking status: Never Smoker   . Smokeless tobacco: Not on file  . Alcohol Use: No    Review of Systems Constitutional: No fever/chills Eyes: No visual changes. ENT: No sore throat. Cardiovascular:  chest pain. Respiratory: shortness of breath. Gastrointestinal: No abdominal  pain.  No nausea, no vomiting.  No diarrhea.  No constipation. Genitourinary: Negative for dysuria. Musculoskeletal:  back pain. Skin: Negative for rash. Neurological: Negative for headaches, focal weakness or numbness.  10-point ROS otherwise negative.  ____________________________________________   PHYSICAL EXAM:  VITAL SIGNS: ED Triage Vitals  Enc Vitals Group     BP 12/25/14 0042 122/80 mmHg     Pulse Rate 12/25/14 0042 101     Resp 12/25/14 0042 22     Temp 12/25/14 0042 97.8 F (36.6 C)     Temp Source 12/25/14 0042 Oral     SpO2 12/25/14 0042 100 %     Weight 12/25/14 0042 120 lb (54.432 kg)     Height 12/25/14 0042 5\' 5"  (1.651 m)     Head Cir --      Peak Flow --      Pain Score 12/25/14 0042 10     Pain Loc --      Pain Edu? --      Excl. in [place]? --     Constitutional: Alert and oriented. Well appearing and in  moderate distress and is tearful Eyes: Conjunctivae are normal. PERRL. EOMI. Head: Atraumatic. Nose: No congestion/rhinnorhea. Mouth/Throat: Mucous membranes are moist.  Oropharynx non-erythematous. Cardiovascular: Normal rate, regular rhythm. Grossly normal heart sounds.  Good peripheral circulation. Respiratory: Normal respiratory effort.  No retractions. Lungs CTAB. Gastrointestinal: Soft and nontender. No distention. Positive bowel sounds Genitourinary: Deferred Musculoskeletal: No lower extremity tenderness nor edema.  Pain to back with bilateral straight leg raise tenderness to palpation mid back and throughout entire lower back. Neurologic:  Normal speech and language. Patient reports some diminished sensation on the right leg throughout patient is able to hold her legs against gravity, but does have pain with it. Skin:  Skin is warm, dry and intact. No rash noted. Psychiatric: Mood and affect are normal.   ____________________________________________   LABS (all labs ordered are listed, but only abnormal results are displayed)  Labs Reviewed - No data to display ____________________________________________  EKG  ED ECG REPORT I, Loney Hering, the attending physician, personally viewed and interpreted this ECG.   Date: 12/25/2014  EKG Time: 157  Rate: 85  Rhythm: normal sinus rhythm, nonspecific ST and T waves changes  Axis: Normal  Intervals:none  ST&T Change: Some T-wave flattening in leads V2 and flipped T waves V3. Similar to EKG 03/2013  ____________________________________________  RADIOLOGY  Chest X-ray: No acute pulmonary process. ____________________________________________   PROCEDURES  Procedure(s) performed: None  Critical Care performed: No  ____________________________________________   INITIAL IMPRESSION / ASSESSMENT AND PLAN / ED COURSE  Pertinent labs & imaging results that were available during my care of the patient were reviewed  by me and considered in my medical decision making (see chart for details).  The patient is having continued back pain which she has been having for over a month. The patient was seen here on July 19 and seen at her doctor's office on the 21st. The patient received 15 Percocet on July 19 and another 12 on July 21. After reading the patient's physician note that there is a concern given the patient's history of chronic pain and the amount of narcotics she has been prescribed in the past month I do not feel comfortable giving the patient a prescription for narcotics for home. Patient did receive a dose of Percocet while waiting as well as 1 when she arrived in the back. The patient also received a dose  of Flexeril. After the medication the patient was able to lie on her back without difficulty which she said that she could not do. I discussed with the patient the amount of prescription she is received in the past month and informed her that I would not be able to give her a prescription. I will put a Lidoderm patch on her back which should help with her pain and give her a prescription for Lidoderm patches. The patient reports that she is unable to afford them but I also did print out prescription coupon card which will only cost the patient $16 for her Lidoderm patches. The patient has an appointment to see neurosurgery tomorrow who can further evaluate the patient's back pain. The patient has had some lumbar and cervical spine pain in the past and has also had MRIs earlier this month of both which were negative at [facility]. I will discharge the patient to home and have her follow back up with her doctor and neurosurgery. ____________________________________________   FINAL CLINICAL IMPRESSION(S) / ED DIAGNOSES  Final diagnoses:  Midline thoracic back pain  Paresthesias  Compression fracture      Loney Hering, MD 12/25/14 [phone]

## 2014-12-25 NOTE — ED Notes (Signed)
MD at bedside. 

## 2014-12-25 NOTE — ED Notes (Signed)
Patient with states that she has a T7 fracture that she was diagnosed with here. Patient states that she has had pain times one month. Patient reports that the pain has become worse. Patient states that she ran out of her oxycodone today.

## 2015-01-17 ENCOUNTER — Other Ambulatory Visit: Payer: Self-pay | Admitting: Orthopedic Surgery

## 2015-01-17 DIAGNOSIS — G8929 Other chronic pain: Secondary | ICD-10-CM

## 2015-01-17 DIAGNOSIS — M5416 Radiculopathy, lumbar region: Secondary | ICD-10-CM

## 2015-01-17 DIAGNOSIS — M545 Low back pain: Secondary | ICD-10-CM

## 2015-02-02 ENCOUNTER — Ambulatory Visit
Admission: RE | Admit: 2015-02-02 | Discharge: 2015-02-02 | Disposition: A | Discharge status: Home / Self Care | Payer: Medicaid Other | Source: Ambulatory Visit | Attending: Orthopedic Surgery | Admitting: Orthopedic Surgery

## 2015-02-02 DIAGNOSIS — M545 Low back pain: Secondary | ICD-10-CM | POA: Diagnosis present

## 2015-02-02 DIAGNOSIS — G8929 Other chronic pain: Secondary | ICD-10-CM

## 2015-02-02 DIAGNOSIS — N9489 Other specified conditions associated with female genital organs and menstrual cycle: Secondary | ICD-10-CM | POA: Diagnosis not present

## 2015-02-02 DIAGNOSIS — M5416 Radiculopathy, lumbar region: Secondary | ICD-10-CM

## 2015-02-02 DIAGNOSIS — M5186 Other intervertebral disc disorders, lumbar region: Secondary | ICD-10-CM | POA: Diagnosis not present

## 2015-02-02 DIAGNOSIS — M79604 Pain in right leg: Secondary | ICD-10-CM | POA: Diagnosis present

## 2015-02-02 HISTORY — DX: Depression, unspecified: F32.A

## 2015-02-02 HISTORY — DX: Headache: R51

## 2015-02-02 HISTORY — DX: Major depressive disorder, single episode, unspecified: F32.9

## 2015-02-02 HISTORY — DX: Headache, unspecified: R51.9

## 2015-02-02 LAB — PROTIME-INR
INR: 0.91
PROTHROMBIN TIME: 12.5 s (ref 11.4–15.0)

## 2015-02-02 LAB — CBC
HCT: 37.6 % (ref 35.0–47.0)
Hemoglobin: 12.6 g/dL (ref 12.0–16.0)
MCH: 29.4 pg (ref 26.0–34.0)
MCHC: 33.6 g/dL (ref 32.0–36.0)
MCV: 87.3 fL (ref 80.0–100.0)
Platelets: 215 10*3/uL (ref 150–440)
RBC: 4.3 MIL/uL (ref 3.80–5.20)
RDW: 13.6 % (ref 11.5–14.5)
WBC: 5 10*3/uL (ref 3.6–11.0)

## 2015-02-02 MED ORDER — MORPHINE SULFATE (PF) 2 MG/ML IV SOLN
2.0000 mg | INTRAVENOUS | Status: AC
Start: 2015-02-02 — End: 2015-02-02
  Administered 2015-02-02: 2 mg via INTRAMUSCULAR

## 2015-02-02 MED ORDER — OXYCODONE-ACETAMINOPHEN 5-325 MG PO TABS
1.0000 | ORAL_TABLET | ORAL | Status: DC | PRN
  Administered 2015-02-02: 1 via ORAL

## 2015-02-02 MED ORDER — DIAZEPAM 5 MG PO TABS
10.0000 mg | ORAL_TABLET | Freq: Once | ORAL | Status: AC
  Administered 2015-02-02: 10 mg via ORAL
  Filled 2015-02-02: qty 2

## 2015-02-02 MED ORDER — SODIUM CHLORIDE 0.9 % IV SOLN
4.0000 mg | Freq: Four times a day (QID) | INTRAVENOUS | Status: DC | PRN
  Filled 2015-02-02: qty 2

## 2015-02-02 NOTE — Progress Notes (Signed)
Patient slept soundly until 1200.  Awakened and c/o "slight" headache and 8:10 low back pain.  Dr. Vernard Gambles notified. Medication given (see e-mar).

## 2015-02-02 NOTE — Procedures (Signed)
Lumbar myelogram No complication No blood loss. See complete dictation in [facility]. CT to follow

## 2015-02-02 NOTE — Progress Notes (Signed)
1030-To CT for pt's c/o 10:10 pain in lower back and right leg post-contrast injection in spine.  (Crying) Morphine 2 mg IM given (see e-mar).  Face "red and splotchy"-no other rash, etc.  Pt. States that her face does this when she cries.  CT scan completed. To SR.  VS stable and pain diminishing to 6:10 (see flowsheet). Denies nausea.  Drinking cola.  Pt. Is presently asleep.  Face has cleared.

## 2015-02-03 ENCOUNTER — Emergency Department
Admission: EM | Admit: 2015-02-03 | Discharge: 2015-02-04 | Disposition: A | Discharge status: Home / Self Care | Payer: Medicaid Other | Attending: Emergency Medicine | Admitting: Emergency Medicine

## 2015-02-03 DIAGNOSIS — G971 Other reaction to spinal and lumbar puncture: Secondary | ICD-10-CM | POA: Diagnosis not present

## 2015-02-03 DIAGNOSIS — R51 Headache: Secondary | ICD-10-CM | POA: Diagnosis present

## 2015-02-03 MED ORDER — LORAZEPAM 2 MG/ML IJ SOLN
1.0000 mg | Freq: Once | INTRAMUSCULAR | Status: AC
  Administered 2015-02-03: 1 mg via INTRAVENOUS
  Filled 2015-02-03: qty 1

## 2015-02-03 MED ORDER — DIPHENHYDRAMINE HCL 50 MG/ML IJ SOLN
25.0000 mg | Freq: Once | INTRAMUSCULAR | Status: AC
  Administered 2015-02-03: 25 mg via INTRAVENOUS
  Filled 2015-02-03: qty 1

## 2015-02-03 MED ORDER — SODIUM CHLORIDE 0.9 % IV SOLN
500.0000 mg | Freq: Once | INTRAVENOUS | Status: AC
Start: 2015-02-03 — End: 2015-02-04
  Administered 2015-02-03: 500 mg via INTRAVENOUS
  Filled 2015-02-03: qty 2

## 2015-02-03 MED ORDER — KETOROLAC TROMETHAMINE 30 MG/ML IJ SOLN
30.0000 mg | Freq: Once | INTRAMUSCULAR | Status: AC
  Administered 2015-02-03: 30 mg via INTRAVENOUS
  Filled 2015-02-03: qty 1

## 2015-02-03 MED ORDER — METOCLOPRAMIDE HCL 5 MG/ML IJ SOLN
10.0000 mg | Freq: Once | INTRAMUSCULAR | Status: AC
Start: 2015-02-03 — End: 2015-02-03
  Administered 2015-02-03: 10 mg via INTRAVENOUS
  Filled 2015-02-03: qty 2

## 2015-02-03 NOTE — ED Provider Notes (Signed)
[facility] Emergency Department Provider Note     Time seen: ----------------------------------------- 10:34 PM on 02/03/2015 -----------------------------------------    I have reviewed the triage vital signs and the nursing notes.   HISTORY  Chief Complaint Headache    HPI Summer Buckley is a 29 y.o. female who presents ER for headache since she had a myelogram yesterday. Patient states that her headache has become more severe this afternoon and she's had some neck pain as well. She feels like she has pressure at her temples that radiates the top of the frontal scalp. She was given oxycodone to take for pain but this is not helped her. She contacted the doctor on call who advised her to come here for evaluation. She denies fevers or chills, sensitive to light.   Past Medical History  Diagnosis Date  . Endometriosis   . PTSD (post-traumatic stress disorder)   . ADHD (attention deficit hyperactivity disorder)   . Anxiety   . Chronic back pain   . Depression   . Headache     There are no active problems to display for this patient.   Past Surgical History  Procedure Laterality Date  . Abdominal hysterectomy    . Appendectomy    . Tonsillectomy    . Back surgery      Allergies Tape  Social History Social History  Substance Use Topics  . Smoking status: Never Smoker   . Smokeless tobacco: None  . Alcohol Use: No    Review of Systems Constitutional: Negative for fever. Eyes: Negative for visual changes. Positive for photophobia ENT: Negative for sore throat. Cardiovascular: Negative for chest pain. Respiratory: Negative for shortness of breath. Gastrointestinal: Negative for abdominal pain, positive for nausea Genitourinary: Negative for dysuria. Musculoskeletal: Negative for back pain. Skin: Negative for rash. Neurological: Positive for headache, right lower extremity weakness  10-point ROS otherwise  negative.  ____________________________________________   PHYSICAL EXAM:  VITAL SIGNS: ED Triage Vitals  Enc Vitals Group     BP 02/03/15 2216 148/79 mmHg     Pulse Rate 02/03/15 2216 150     Resp --      Temp 02/03/15 2216 99.4 F (37.4 C)     Temp Source 02/03/15 2216 Oral     SpO2 02/03/15 2216 98 %     Weight 02/03/15 2216 115 lb (52.164 kg)     Height 02/03/15 2216 5\' 5"  (1.651 m)     Head Cir --      Peak Flow --      Pain Score 02/03/15 2218 10     Pain Loc --      Pain Edu? --      Excl. in [place]? --     Constitutional: Alert and oriented. Mild distress Eyes: Conjunctivae are normal. PERRL. Normal extraocular movements. Photophobia is present ENT   Head: Normocephalic and atraumatic.   Nose: No congestion/rhinnorhea.   Mouth/Throat: Mucous membranes are moist.   Neck: No stridor. Cardiovascular: Normal rate, regular rhythm. Normal and symmetric distal pulses are present in all extremities. No murmurs, rubs, or gallops. Respiratory: Normal respiratory effort without tachypnea nor retractions. Breath sounds are clear and equal bilaterally. No wheezes/rales/rhonchi. Gastrointestinal: Soft and nontender. No distention. No abdominal bruits.  Musculoskeletal: Nontender with normal range of motion in all extremities. No joint effusions.  No lower extremity tenderness nor edema. Neurologic:  Normal speech and language. No gross focal neurologic deficits are appreciated. Speech is normal. No gait instability. Skin:  Skin  is warm, dry and intact. No rash noted. ____________________________________________  ED COURSE:  Pertinent labs & imaging results that were available during my care of the patient were reviewed by me and considered in my medical decision making (see chart for details). Patient with possible spinal headache. She'll be given fluids, caffeine, anxiolytics and pain medication.  ____________________________________________  FINAL ASSESSMENT AND  PLAN  Spinal headache  Plan: Patient with labs and imaging as dictated above. Patient given the above medications, final disposition will be dictated by Dr. Dahlia Client.   Earleen Newport, MD   Earleen Newport, MD 02/03/15 [phone]

## 2015-02-03 NOTE — ED Notes (Signed)
MD at bedside for reeval

## 2015-02-03 NOTE — ED Notes (Signed)
Patient reports that has had HA pain since myelography 9/2.  Reports pain became more severe this afternoon with added neck pain.  Pressure at the temples that radiates to the top of frontal area.  Given oxycodone to take for pain Q4H, took last dose 2 hours ago that is giving no relief.  Also, took excedrin migraine about 4 hours ago. Reports contacted MD who advised her to come to the ED.

## 2015-02-04 MED ORDER — IBUPROFEN 800 MG PO TABS
800.0000 mg | ORAL_TABLET | Freq: Once | ORAL | Status: AC
  Administered 2015-02-04: 800 mg via ORAL
  Filled 2015-02-04: qty 1

## 2015-02-04 MED ORDER — BUTALBITAL-APAP-CAFFEINE 50-325-40 MG PO TABS: 1.0000 | ORAL_TABLET | Freq: Four times a day (QID) | ORAL | Status: AC | PRN

## 2015-02-04 NOTE — ED Provider Notes (Signed)
-----------------------------------------   1:23 AM on 02/04/2015 -----------------------------------------   Blood pressure 131/70, pulse 94, temperature 99.4 F (37.4 C), temperature source Oral, resp. rate 18, height 5\' 5"  (1.651 m), weight 115 lb (52.164 kg), SpO2 99 %.  Assuming care from Dr. Jimmye Norman.  In short, Summer Buckley is a 29 y.o. female with a chief complaint of Headache .  Refer to the original H&P for additional details.  The current plan of care is to follow up the patient after her medication. The patient reports that she feels improved. She will be discharged to home to follow up with neurology.   Loney Hering, MD 02/04/15 [phone]

## 2015-02-04 NOTE — Discharge Instructions (Signed)
Spinal Headache °A spinal headache is a severe headache that can happen after getting a spinal tap, also called lumbar puncture, or an epidural anesthetic. Both of these procedures involve passing a needle through ligaments that run along the back side of your spinal column and into one of the spaces just above your spinal cord. Sometimes spinal fluid leaks through the temporary hole left by the needle. This leak causes a decrease in spinal fluid pressure, which leads to a spinal headache. The headache usually begins within hours or 1-2 days after the procedure, and it lasts until adequate pressure returns as your body creates more spinal fluid. The headache can last a few days and rarely lasts for more than 1 week. °SIGNS AND SYMPTOMS  °· Severe headache pain when sitting or standing. °· Decreased headache pain when lying down. °· Neck pain, especially when flexing the neck in a chin-to-chest position. °· Vomiting. °DIAGNOSIS  °Diagnosis of spinal headache is usually made based on your recent medical history. Your health care provider will consider the timing of a recent spinal tap or epidural anesthetic, along with how soon your headache occurred afterward. On rare occasions, tests may be done to confirm the diagnosis, such as an MRI. °TREATMENT  °Treatment may include: °· Drinking extra fluids to improve your level of hydration. This will help your body replace the spinal fluid that has leaked out through the needle hole. Receiving IV fluids may be necessary. °· Taking pain medicine as prescribed by your health care provider. °· Drinking caffeinated beverages such as soda, coffee, or tea. Caffeine may help to shrink the blood vessels in your brain, which may reduce your headache pain. °· Lying flat for a few days. °· Having a blood patch procedure, which involves injecting a small amount of your blood at the puncture site to seal the leak. °HOME CARE INSTRUCTIONS °· Lie down to relieve pain if your pain gets  worse when you sit or stand. °· Drink enough fluids to keep your urine clear or pale yellow. °· Take pain medicine as directed by your health care provider. °SEEK IMMEDIATE MEDICAL CARE IF:  °· Your pain becomes very severe or cannot be controlled. °· You develop a fever. °· You have a stiff neck. °· You lose bowel or bladder control. °· You have trouble walking. °MAKE SURE YOU: °· Understand these instructions. °· Will watch your condition.   °· Will get help right away if you are not doing well or get worse. °Document Released: 11/08/2001 Document Revised: 05/24/2013 Document Reviewed: 12/09/2012 °ExitCare® Patient Information ©2015 [employer]. This information is not intended to replace advice given to you by your health care provider. Make sure you discuss any questions you have with your health care provider. ° °

## 2015-02-07 DIAGNOSIS — F1190 Opioid use, unspecified, uncomplicated: Secondary | ICD-10-CM | POA: Insufficient documentation

## 2015-02-07 DIAGNOSIS — R262 Difficulty in walking, not elsewhere classified: Secondary | ICD-10-CM | POA: Insufficient documentation

## 2015-02-07 DIAGNOSIS — R200 Anesthesia of skin: Secondary | ICD-10-CM | POA: Insufficient documentation

## 2015-02-07 DIAGNOSIS — F319 Bipolar disorder, unspecified: Secondary | ICD-10-CM | POA: Insufficient documentation

## 2015-02-09 ENCOUNTER — Emergency Department
Admission: EM | Admit: 2015-02-09 | Discharge: 2015-02-09 | Disposition: A | Discharge status: Home / Self Care | Payer: Medicaid Other | Attending: Emergency Medicine | Admitting: Emergency Medicine

## 2015-02-09 ENCOUNTER — Emergency Department: Payer: Medicaid Other

## 2015-02-09 ENCOUNTER — Encounter: Payer: Self-pay | Admitting: Emergency Medicine

## 2015-02-09 DIAGNOSIS — R102 Pelvic and perineal pain: Secondary | ICD-10-CM

## 2015-02-09 DIAGNOSIS — Z791 Long term (current) use of non-steroidal anti-inflammatories (NSAID): Secondary | ICD-10-CM | POA: Diagnosis not present

## 2015-02-09 DIAGNOSIS — R1031 Right lower quadrant pain: Secondary | ICD-10-CM

## 2015-02-09 DIAGNOSIS — Z79899 Other long term (current) drug therapy: Secondary | ICD-10-CM | POA: Insufficient documentation

## 2015-02-09 DIAGNOSIS — N390 Urinary tract infection, site not specified: Secondary | ICD-10-CM

## 2015-02-09 DIAGNOSIS — F1510 Other stimulant abuse, uncomplicated: Secondary | ICD-10-CM | POA: Diagnosis not present

## 2015-02-09 DIAGNOSIS — F1910 Other psychoactive substance abuse, uncomplicated: Secondary | ICD-10-CM | POA: Diagnosis not present

## 2015-02-09 DIAGNOSIS — N858 Other specified noninflammatory disorders of uterus: Secondary | ICD-10-CM | POA: Insufficient documentation

## 2015-02-09 DIAGNOSIS — F1310 Sedative, hypnotic or anxiolytic abuse, uncomplicated: Secondary | ICD-10-CM | POA: Diagnosis not present

## 2015-02-09 DIAGNOSIS — F1110 Opioid abuse, uncomplicated: Secondary | ICD-10-CM | POA: Diagnosis not present

## 2015-02-09 LAB — COMPREHENSIVE METABOLIC PANEL
ALBUMIN: 4.6 g/dL (ref 3.5–5.0)
ALT: 15 U/L (ref 14–54)
AST: 22 U/L (ref 15–41)
Alkaline Phosphatase: 62 U/L (ref 38–126)
Anion gap: 7 (ref 5–15)
BUN: 11 mg/dL (ref 6–20)
CHLORIDE: 103 mmol/L (ref 101–111)
CO2: 28 mmol/L (ref 22–32)
CREATININE: 0.65 mg/dL (ref 0.44–1.00)
Calcium: 9.3 mg/dL (ref 8.9–10.3)
GFR calc Af Amer: >60 mL/min (ref >60)
GFR calc non Af Amer: >60 mL/min (ref >60)
Glucose, Bld: 97 mg/dL (ref 65–99)
POTASSIUM: 3.6 mmol/L (ref 3.5–5.1)
SODIUM: 138 mmol/L (ref 135–145)
Total Bilirubin: 0.4 mg/dL (ref 0.3–1.2)
Total Protein: 7.6 g/dL (ref 6.5–8.1)

## 2015-02-09 LAB — CBC WITH DIFFERENTIAL/PLATELET
BASOS ABS: 0 10*3/uL (ref 0–0.1)
BASOS PCT: 1 %
EOS ABS: 0.1 10*3/uL (ref 0–0.7)
EOS PCT: 2 %
HCT: 37.4 % (ref 35.0–47.0)
Hemoglobin: 13 g/dL (ref 12.0–16.0)
LYMPHS PCT: 34 %
Lymphs Abs: 1.9 10*3/uL (ref 1.0–3.6)
MCH: 30.4 pg (ref 26.0–34.0)
MCHC: 34.8 g/dL (ref 32.0–36.0)
MCV: 87.3 fL (ref 80.0–100.0)
Monocytes Absolute: 0.2 10*3/uL (ref 0.2–0.9)
Monocytes Relative: 4 %
Neutro Abs: 3.3 10*3/uL (ref 1.4–6.5)
Neutrophils Relative %: 59 %
PLATELETS: 229 10*3/uL (ref 150–440)
RBC: 4.28 MIL/uL (ref 3.80–5.20)
RDW: 13.6 % (ref 11.5–14.5)
WBC: 5.6 10*3/uL (ref 3.6–11.0)

## 2015-02-09 LAB — URINALYSIS COMPLETE WITH MICROSCOPIC (ARMC ONLY)
Bilirubin Urine: NEGATIVE
Glucose, UA: NEGATIVE mg/dL
Hgb urine dipstick: NEGATIVE
KETONES UR: NEGATIVE mg/dL
NITRITE: NEGATIVE
PH: 6 (ref 5.0–8.0)
PROTEIN: NEGATIVE mg/dL
SPECIFIC GRAVITY, URINE: 1.019 (ref 1.005–1.030)

## 2015-02-09 LAB — URINE DRUG SCREEN, QUALITATIVE (ARMC ONLY)
Amphetamines, Ur Screen: POSITIVE — AB
BARBITURATES, UR SCREEN: POSITIVE — AB
Benzodiazepine, Ur Scrn: POSITIVE — AB
CANNABINOID 50 NG, UR ~~LOC~~: NOT DETECTED
COCAINE METABOLITE, UR ~~LOC~~: NOT DETECTED
MDMA (ECSTASY) UR SCREEN: NOT DETECTED
Methadone Scn, Ur: NOT DETECTED
OPIATE, UR SCREEN: POSITIVE — AB
Phencyclidine (PCP) Ur S: NOT DETECTED
Tricyclic, Ur Screen: NOT DETECTED

## 2015-02-09 MED ORDER — CEPHALEXIN 500 MG PO CAPS: 500.0000 mg | ORAL_CAPSULE | Freq: Three times a day (TID) | ORAL | Status: DC

## 2015-02-09 MED ORDER — PHENAZOPYRIDINE HCL 200 MG PO TABS: 200.0000 mg | ORAL_TABLET | Freq: Three times a day (TID) | ORAL | Status: AC | PRN

## 2015-02-09 MED ORDER — ALBUTEROL SULFATE HFA 108 (90 BASE) MCG/ACT IN AERS: 2.0000 | INHALATION_SPRAY | RESPIRATORY_TRACT | Status: DC | PRN

## 2015-02-09 MED ORDER — OXYCODONE-ACETAMINOPHEN 5-325 MG PO TABS
1.0000 | ORAL_TABLET | ORAL | Status: AC
  Administered 2015-02-09: 1 via ORAL
  Filled 2015-02-09: qty 1

## 2015-02-09 MED ORDER — AEROCHAMBER PLUS FLO-VU SMALL MISC
1.0000 | Freq: Once | Status: DC
Start: 2015-02-09 — End: 2015-02-09

## 2015-02-09 MED ORDER — DICYCLOMINE HCL 10 MG/ML IM SOLN
20.0000 mg | Freq: Once | INTRAMUSCULAR | Status: AC
  Administered 2015-02-09: 20 mg via INTRAMUSCULAR
  Filled 2015-02-09: qty 2

## 2015-02-09 MED ORDER — KETOROLAC TROMETHAMINE 30 MG/ML IJ SOLN: 30.0000 mg | Freq: Once | INTRAMUSCULAR | Status: DC

## 2015-02-09 NOTE — ED Notes (Signed)
Pt presents to ED with stabbing lower abd pain and pelvic pressure. Called [facility] and was told to come to ED. Also reports vaginal bleeding and abd swelling tonight at 2000 which is abnormal due to complete hysterectomy in 2013. Pt states she is normally skinny but the swelling in her abd makes her look like she is pregnant. Denies vomiting.

## 2015-02-09 NOTE — ED Provider Notes (Signed)
[facility] Emergency Department Provider Note   ____________________________________________  Time seen: 86  I have reviewed the triage vital signs and the nursing notes.   HISTORY  Chief Complaint Abdominal Pain   History limited by: Not Limited   HPI Summer Buckley is a 29 y.o. female who presents to the emergency department today because of concerns for right lower quadrant pain and abdominal bloating. She states that the pain has gotten worse over the course of the past roughly 10 hours. It is located right lower quadrant. It is severe. Patient has had some associated nausea. She has had associated abdominal distention. States that her last bowel movement was slightly before the pain started. She states she has been passing gas. She denies any fevers.     Past Medical History  Diagnosis Date  . Endometriosis   . PTSD (post-traumatic stress disorder)   . ADHD (attention deficit hyperactivity disorder)   . Anxiety   . Chronic back pain   . Depression   . Headache     There are no active problems to display for this patient.   Past Surgical History  Procedure Laterality Date  . Abdominal hysterectomy    . Appendectomy    . Tonsillectomy    . Back surgery      Current Outpatient Rx  Name  Route  Sig  Dispense  Refill  . amphetamine-dextroamphetamine (ADDERALL) 20 MG tablet   Oral   Take 20 mg by mouth 2 (two) times daily.         Marland Kitchen buPROPion (WELLBUTRIN SR) 200 MG 12 hr tablet   Oral   Take 200 mg by mouth every morning.         . butalbital-acetaminophen-caffeine (FIORICET) 50-325-40 MG per tablet   Oral   Take 1-2 tablets by mouth every 6 (six) hours as needed for headache.   12 tablet   0   . cyclobenzaprine (FLEXERIL) 5 MG tablet   Oral   Take 5 mg by mouth 3 (three) times daily.         . diazepam (VALIUM) 5 MG tablet   Oral   Take 5 mg by mouth every 8 (eight) hours as needed for anxiety.         .  diclofenac (VOLTAREN) 75 MG EC tablet   Oral   Take 75 mg by mouth 2 (two) times daily.         Marland Kitchen oxyCODONE (OXY IR/ROXICODONE) 5 MG immediate release tablet   Oral   Take 5 mg by mouth every 4 (four) hours as needed for severe pain.         . traMADol (ULTRAM) 50 MG tablet   Oral   Take 50 mg by mouth every 6 (six) hours as needed.           Allergies Tape  No family history on file.  Social History Social History  Substance Use Topics  . Smoking status: Never Smoker   . Smokeless tobacco: None  . Alcohol Use: No    Review of Systems  Constitutional: Negative for fever. Cardiovascular: Negative for chest pain. Respiratory: Negative for shortness of breath. Gastrointestinal: Positive for abdominal pain and distention positive for nausea Genitourinary: Negative for dysuria. Musculoskeletal: Negative for back pain. Skin: Negative for rash. Neurological: Negative for headaches, focal weakness or numbness.  10-point ROS otherwise negative.  ____________________________________________   PHYSICAL EXAM:  VITAL SIGNS: ED Triage Vitals  Enc Vitals Group  BP 02/09/15 0254 135/93 mmHg     Pulse Rate 02/09/15 0254 114     Resp 02/09/15 0254 20     Temp 02/09/15 0254 98.3 F (36.8 C)     Temp Source 02/09/15 0254 Oral     SpO2 02/09/15 0254 99 %     Weight 02/09/15 0254 120 lb (54.432 kg)     Height 02/09/15 0254 5\' 5"  (1.651 m)     Head Cir --      Peak Flow --      Pain Score 02/09/15 0255 9   Constitutional: Alert and oriented. Well appearing and in no distress. Eyes: Conjunctivae are normal. PERRL. Normal extraocular movements. ENT   Head: Normocephalic and atraumatic.   Nose: No congestion/rhinnorhea.   Mouth/Throat: Mucous membranes are moist.   Neck: No stridor. Hematological/Lymphatic/Immunilogical: No cervical lymphadenopathy. Cardiovascular: Normal rate, regular rhythm.  No murmurs, rubs, or gallops. Respiratory: Normal  respiratory effort without tachypnea nor retractions. Breath sounds are clear and equal bilaterally. No wheezes/rales/rhonchi. Gastrointestinal: Soft and somewhat diffusely tender with worse tenderness in the right lower quadrant. Patient's abdomen is distended and tympanitic. Genitourinary: Deferred Musculoskeletal: Normal range of motion in all extremities. No joint effusions.  No lower extremity tenderness nor edema. Neurologic:  Normal speech and language. No gross focal neurologic deficits are appreciated. Speech is normal.  Skin:  Skin is warm, dry and intact. No rash noted. Psychiatric: Mood and affect are normal. Speech and behavior are normal. Patient exhibits appropriate insight and judgment.  ____________________________________________    LABS (pertinent positives/negatives)  Labs Reviewed  URINE DRUG SCREEN, QUALITATIVE ([facility] ONLY) - Abnormal; Notable for the following:    Amphetamines, Ur Screen POSITIVE (*)    Opiate, Ur Screen POSITIVE (*)    Barbiturates, Ur Screen POSITIVE (*)    Benzodiazepine, Ur Scrn POSITIVE (*)    All other components within normal limits  CBC WITH DIFFERENTIAL/PLATELET  COMPREHENSIVE METABOLIC PANEL     ____________________________________________   EKG  None  ____________________________________________    RADIOLOGY  3 way abd  ____________________________________________   PROCEDURES  Procedure(s) performed: None  Critical Care performed: No  ____________________________________________   INITIAL IMPRESSION / ASSESSMENT AND PLAN / ED COURSE  Pertinent labs & imaging results that were available during my care of the patient were reviewed by me and considered in my medical decision making (see chart for details).  Patient presented to the emergency department today with right lower quadrant pain. On exam patient with a mildly distended abdomen and tenderness in the right lower quadrant. Patient is status post  appendectomy. 3 way abdominal x-rays were obtained which did not show any signs of small bowel obstruction. Given the patient continues to have pain will get an ultrasound. Patient's urine is concerning for UTI so will prepare antibiotics prescription.  ____________________________________________   FINAL CLINICAL IMPRESSION(S) / ED DIAGNOSES  Final diagnoses:  Right lower quadrant abdominal pain  UTI (lower urinary tract infection)  Right adnexal tenderness  Right adnexal tenderness     Nance Pear, MD 02/09/15 [phone]

## 2015-02-09 NOTE — ED Notes (Signed)
Reviewed discharge instructions with patient including prescriptions given as well as follow up information. Patient verbalized understanding of same.

## 2015-02-09 NOTE — Discharge Instructions (Signed)
Please seek medical attention for any high fevers, chest pain, shortness of breath, change in behavior, persistent vomiting, bloody stool or any other new or concerning symptoms. ° °Urinary Tract Infection °Urinary tract infections (UTIs) can develop anywhere along your urinary tract. Your urinary tract is your body's drainage system for removing wastes and extra water. Your urinary tract includes two kidneys, two ureters, a bladder, and a urethra. Your kidneys are a pair of bean-shaped organs. Each kidney is about the size of your fist. They are located below your ribs, one on each side of your spine. °CAUSES °Infections are caused by microbes, which are microscopic organisms, including fungi, viruses, and bacteria. These organisms are so small that they can only be seen through a microscope. Bacteria are the microbes that most commonly cause UTIs. °SYMPTOMS  °Symptoms of UTIs may vary by age and gender of the patient and by the location of the infection. Symptoms in young women typically include a frequent and intense urge to urinate and a painful, burning feeling in the bladder or urethra during urination. Older women and men are more likely to be tired, shaky, and weak and have muscle aches and abdominal pain. A fever may mean the infection is in your kidneys. Other symptoms of a kidney infection include pain in your back or sides below the ribs, nausea, and vomiting. °DIAGNOSIS °To diagnose a UTI, your caregiver will ask you about your symptoms. Your caregiver also will ask to provide a urine sample. The urine sample will be tested for bacteria and white blood cells. White blood cells are made by your body to help fight infection. °TREATMENT  °Typically, UTIs can be treated with medication. Because most UTIs are caused by a bacterial infection, they usually can be treated with the use of antibiotics. The choice of antibiotic and length of treatment depend on your symptoms and the type of bacteria causing your  infection. °HOME CARE INSTRUCTIONS °· If you were prescribed antibiotics, take them exactly as your caregiver instructs you. Finish the medication even if you feel better after you have only taken some of the medication. °· Drink enough water and fluids to keep your urine clear or pale yellow. °· Avoid caffeine, tea, and carbonated beverages. They tend to irritate your bladder. °· Empty your bladder often. Avoid holding urine for long periods of time. °· Empty your bladder before and after sexual intercourse. °· After a bowel movement, women should cleanse from front to back. Use each tissue only once. °SEEK MEDICAL CARE IF:  °· You have back pain. °· You develop a fever. °· Your symptoms do not begin to resolve within 3 days. °SEEK IMMEDIATE MEDICAL CARE IF:  °· You have severe back pain or lower abdominal pain. °· You develop chills. °· You have nausea or vomiting. °· You have continued burning or discomfort with urination. °MAKE SURE YOU:  °· Understand these instructions. °· Will watch your condition. °· Will get help right away if you are not doing well or get worse. °Document Released: 02/26/2005 Document Revised: 11/18/2011 Document Reviewed: 06/27/2011 °ExitCare® Patient Information ©2015 [employer]. This information is not intended to replace advice given to you by your health care provider. Make sure you discuss any questions you have with your health care provider. ° °

## 2015-02-09 NOTE — ED Provider Notes (Signed)
US Pelvis Complete (Final result) Result time: 02/09/15 08:22:34   Final result by Rad Results In Interface (02/09/15 08:22:34)   Narrative:   CLINICAL DATA: Right lower quadrant pain and bloating over the past 10 hours, worsening.  EXAM: TRANSABDOMINAL ULTRASOUND OF PELVIS  TECHNIQUE: Transabdominal ultrasound examination of the pelvis was performed including evaluation of the uterus, ovaries, adnexal regions, and pelvic cul-de-sac.  COMPARISON: None.  FINDINGS: Uterus  Removed.  Right ovary  Removed.  Left ovary  Removed.  Other findings: No free fluid  IMPRESSION: Status post hysterectomy and bilateral oophorectomy. No acute abnormality.   Electronically Signed By: Inge Rise M.D. On: 02/09/2015 08:22          DG Abd Acute W/Chest (Final result) Result time: 02/09/15 06:38:17   Final result by Rad Results In Interface (02/09/15 06:38:17)   Narrative:   CLINICAL DATA: Abdominal pain and bleeding, swelling beginning at 8 p.m. recent lumbar myelogram.  EXAM: DG ABDOMEN ACUTE W/ 1V CHEST  COMPARISON: Chest radiograph December 25, 2014  FINDINGS: Cardiomediastinal silhouette is unremarkable. Lungs are clear, no pleural effusions. No pneumothorax. Soft tissue planes and included osseous structures are unremarkable.  Bowel gas pattern is nondilated and nonobstructive. Moderate amount of retained large bowel stool. No intra-abdominal mass effect, pathologic calcifications or free air. Soft tissue planes and included osseous structures are nonsuspicious.  IMPRESSION: No acute cardiopulmonary process.  Moderate amount of retained large bowel stool without bowel obstruction.      On reevaluation the patient is awake and alert. 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  Her  reevaluation her abdomen is soft, mild to moderate tenderness suprapubically. There is no rebound or guarding. She is overall very well appearing. Discussed with the patient, she suspects is secondary to endometriosis which she has a long-standing history of. We'll give her antibiotic's as previously written by Dr. Archie Balboa, follow up with OB/GYN doctor which she will be establishing for ongoing care. Return precautions advised.  Delman Kitten, MD 02/09/15 [phone]

## 2015-02-09 NOTE — ED Notes (Signed)
Pt to xray

## 2015-02-09 NOTE — ED Notes (Signed)
Pt in with co rlq pain for months became worse tonight.  States has been told she has dilated left ovarian vein.  Denies any n.v.d or dysuria states pain feels like pressure.  Abd soft and tender on palp to rlq.

## 2015-05-15 ENCOUNTER — Ambulatory Visit
Admission: EM | Admit: 2015-05-15 | Discharge: 2015-05-15 | Disposition: A | Discharge status: Home / Self Care | Payer: Medicaid Other | Attending: Family Medicine | Admitting: Family Medicine

## 2015-05-15 DIAGNOSIS — J383 Other diseases of vocal cords: Secondary | ICD-10-CM | POA: Diagnosis not present

## 2015-05-15 DIAGNOSIS — F4310 Post-traumatic stress disorder, unspecified: Secondary | ICD-10-CM | POA: Diagnosis not present

## 2015-05-15 DIAGNOSIS — F329 Major depressive disorder, single episode, unspecified: Secondary | ICD-10-CM | POA: Diagnosis not present

## 2015-05-15 DIAGNOSIS — F909 Attention-deficit hyperactivity disorder, unspecified type: Secondary | ICD-10-CM | POA: Insufficient documentation

## 2015-05-15 DIAGNOSIS — F419 Anxiety disorder, unspecified: Secondary | ICD-10-CM | POA: Insufficient documentation

## 2015-05-15 DIAGNOSIS — J029 Acute pharyngitis, unspecified: Secondary | ICD-10-CM | POA: Diagnosis not present

## 2015-05-15 LAB — RAPID STREP SCREEN (MED CTR MEBANE ONLY): STREPTOCOCCUS, GROUP A SCREEN (DIRECT): NEGATIVE

## 2015-05-15 MED ORDER — RACEPINEPHRINE HCL 2.25 % IN NEBU
0.5000 mL | INHALATION_SOLUTION | Freq: Once | RESPIRATORY_TRACT | Status: AC
  Administered 2015-05-15: 0.5 mL via RESPIRATORY_TRACT

## 2015-05-15 MED ORDER — CEFUROXIME AXETIL 500 MG PO TABS: 500.0000 mg | ORAL_TABLET | Freq: Two times a day (BID) | ORAL | Status: DC

## 2015-05-15 MED ORDER — CLONAZEPAM 0.5 MG PO TABS: 0.5000 mg | ORAL_TABLET | Freq: Two times a day (BID) | ORAL | Status: DC | PRN

## 2015-05-15 MED ORDER — PREDNISONE 10 MG (21) PO TBPK: ORAL_TABLET | ORAL | Status: DC

## 2015-05-15 MED ORDER — METHYLPREDNISOLONE SODIUM SUCC 125 MG IJ SOLR
125.0000 mg | Freq: Once | INTRAMUSCULAR | Status: AC
  Administered 2015-05-15: 125 mg via INTRAMUSCULAR

## 2015-05-15 MED ORDER — HYDROCOD POLST-CPM POLST ER 10-8 MG/5ML PO SUER: 5.0000 mL | Freq: Two times a day (BID) | ORAL | Status: DC | PRN

## 2015-05-15 NOTE — Discharge Instructions (Signed)
Laryngitis  Laryngitis is swelling (inflammation) of your vocal cords. This causes hoarseness, coughing, loss of voice, sore throat, or a dry throat. When your vocal cords are inflamed, your voice sounds different.  Laryngitis can be temporary (acute) or long-term (chronic). Most cases of acute laryngitis improve with time. Chronic laryngitis is laryngitis that lasts for more than three weeks.  HOME CARE  · Drink enough fluid to keep your pee (urine) clear or pale yellow.  · Breathe in moist air. Use a humidifier if you live in a dry climate.  · Take medicines only as told by your doctor.  · Do not smoke cigarettes or electronic cigarettes. If you need help quitting, ask your doctor.  · Talk as little as possible. Also avoid whispering, which can cause vocal strain.  · Write instead of talking. Do this until your voice is back to normal.  GET HELP IF:  · You have a fever.  · Your pain is worse.  · You have trouble swallowing.  GET HELP RIGHT AWAY IF:  · You cough up blood.  · You have trouble breathing.     This information is not intended to replace advice given to you by your health care provider. Make sure you discuss any questions you have with your health care provider.     Document Released: 05/08/2011 Document Revised: 06/09/2014 Document Reviewed: 11/01/2013  Elsevier Interactive Patient Education ©2016 [employer]

## 2015-05-15 NOTE — ED Notes (Signed)
States "feels only a little better". Remains with inspiratory stridor, Room air pulse ox 99%. Dr. Alveta Heimlich informed and Dr. Maisie Fus office contacted and Dr. Alveta Heimlich spoke directly with him.

## 2015-05-15 NOTE — ED Notes (Signed)
Initial symptom of sore throat started two days ago and has progressed into difficulty breathing and cough. 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

## 2015-05-15 NOTE — ED Provider Notes (Signed)
CSN: [id number]     Arrival date & time 05/15/15  1404 History   First MD Initiated Contact with Patient 05/15/15 1507   Nurses notes were reviewed.  Chief Complaint  Patient presents with  . Sore Throat  . Cough  . Respiratory Distress   patient is a 29 year old white female who states that she's had upper respiratory tract infection for about 3 days. She states today she started having irritation of the vocal cords. 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 The last one was about 2 years ago. She states that she's had to have scoping to see was going on with the vocal cords and this had to be hospitalized as well with stage even in the ICU. She reports feeling short of breath and difficult to breathing now   (Consider location/radiation/quality/duration/timing/severity/associated sxs/prior Treatment) Patient is a 29 y.o. female presenting with pharyngitis and cough. The history is provided by the patient. No language interpreter was used.  Sore Throat This is a new problem. The current episode started 6 to 12 hours ago. The problem occurs constantly. The problem has been gradually worsening. Associated symptoms include shortness of breath. Nothing relieves the symptoms. She has tried nothing for the symptoms.  Cough Associated symptoms: rhinorrhea and shortness of breath   Associated symptoms: no wheezing     Past Medical History  Diagnosis Date  . Endometriosis   . PTSD (post-traumatic stress disorder)   . ADHD (attention deficit hyperactivity disorder)   . Anxiety   . Chronic back pain   . Depression   . Headache    Past Surgical History  Procedure Laterality Date  . Abdominal hysterectomy    . Appendectomy    . Tonsillectomy    . Back surgery     No family history on file. Social History  Substance Use Topics  . Smoking status: Never Smoker   . Smokeless tobacco:  Not on file  . Alcohol Use: No   OB History    No data available     Review of Systems  Constitutional: Positive for activity change.  HENT: Positive for congestion and rhinorrhea.   Respiratory: Positive for cough and shortness of breath. Negative for wheezing.   All other systems reviewed and are negative.   Allergies  Tape  Home Medications   Prior to Admission medications   Medication Sig Start Date End Date Taking? Authorizing Provider  amphetamine-dextroamphetamine (ADDERALL) 20 MG tablet Take 20 mg by mouth 2 (two) times daily.   Yes Historical Provider, MD  buPROPion (WELLBUTRIN SR) 200 MG 12 hr tablet Take 200 mg by mouth every morning.   Yes Historical Provider, MD  butalbital-acetaminophen-caffeine (FIORICET) 50-325-40 MG per tablet Take 1-2 tablets by mouth every 6 (six) hours as needed for headache. 02/04/15 02/04/16 Yes Loney Hering, MD  cyclobenzaprine (FLEXERIL) 5 MG tablet Take 5 mg by mouth 3 (three) times daily.   Yes Historical Provider, MD  diazepam (VALIUM) 10 MG tablet Take 10 mg by mouth 3 (three) times daily.   Yes Historical Provider, MD  diclofenac (VOLTAREN) 75 MG EC tablet Take 75 mg by mouth 2 (two) times daily.   Yes Historical Provider, MD  oxyCODONE (OXY IR/ROXICODONE) 5 MG immediate release tablet Take 5-10 mg by mouth every 4 (four) hours. Every 4 to 6 hours   Yes Historical Provider, MD  phenazopyridine (PYRIDIUM) 200 MG tablet  Take 1 tablet (200 mg total) by mouth 3 (three) times daily as needed for pain. 02/09/15 02/09/16 Yes Nance Pear, MD  pregabalin (LYRICA) 75 MG capsule Take 75-150 mg by mouth 2 (two) times daily. 1 capsule every morning, 2 capsules at bedtime   Yes Historical Provider, MD  cefUROXime (CEFTIN) 500 MG tablet Take 1 tablet (500 mg total) by mouth 2 (two) times daily. 05/15/15   Frederich Cha, MD  cephALEXin (KEFLEX) 500 MG capsule Take 1 capsule (500 mg total) by mouth 3 (three) times daily. 02/09/15   Nance Pear, MD   chlorpheniramine-HYDROcodone (TUSSIONEX PENNKINETIC ER) 10-8 MG/5ML SUER Take 5 mLs by mouth every 12 (twelve) hours as needed for cough. 05/15/15   Frederich Cha, MD  clonazePAM (KLONOPIN) 0.5 MG tablet Take 1 tablet (0.5 mg total) by mouth 2 (two) times daily as needed for anxiety. 05/15/15   Frederich Cha, MD  predniSONE (STERAPRED UNI-PAK 21 TAB) 10 MG (21) TBPK tablet Sig 6 tablet day 1, 5 tablets day 2, 4 tablets day 3,,3tablets day 4, 2 tablets day 5, 1 tablet day 6 take all tablets orally 05/15/15   Frederich Cha, MD   Meds Ordered and Administered this Visit   Medications  Racepinephrine HCl 2.25 % nebulizer solution 0.5 mL (0.5 mLs Nebulization Given 05/15/15 1522)  methylPREDNISolone sodium succinate (SOLU-MEDROL) 125 mg/2 mL injection 125 mg (125 mg Intramuscular Given 05/15/15 1521)    BP 124/86 mmHg  Pulse 93  Temp(Src) 98.6 F (37 C) (Oral)  Resp 24  Ht 5\' 5"  (1.651 m)  SpO2 100% No data found.   Physical Exam  Constitutional: She is oriented to person, place, and time. She appears well-developed and well-nourished. She appears distressed.  HENT:  Head: Normocephalic and atraumatic.  Eyes: Conjunctivae are normal. Pupils are equal, round, and reactive to light.  Neck: Normal range of motion. Neck supple.  Cardiovascular: Normal rate, regular rhythm and normal heart sounds.   Pulmonary/Chest: She has decreased breath sounds.  Stridor is present. The patient reports shortness of breath has continued to get worse today.  Musculoskeletal: Normal range of motion. She exhibits no edema or tenderness.  Lymphadenopathy:    She has cervical adenopathy.  Neurological: She is alert and oriented to person, place, and time.  Skin: Skin is warm and dry.  Psychiatric: She has a normal mood and affect.  Vitals reviewed.   ED Course  Procedures (including critical care time)  Labs Review Labs Reviewed  RAPID STREP SCREEN (NOT AT [facility])  CULTURE, GROUP A STREP ([facility] ONLY)     Imaging Review No results found.   Visual Acuity Review  Right Eye Distance:   Left Eye Distance:   Bilateral Distance:    Right Eye Near:   Left Eye Near:    Bilateral Near:        MDM   1. Disorder of vocal cords     Once she has informed me of her disorder and problem with vocal cords that is consistent with the lack of wheezing on physical examination and the mild distressed that she's having. Gave her a treatment of racemic epinephrine and warned that was going give one in the urgent care. Will administer 125 mg Solu-Medrol IM. If not showing improvement she will need to go to the ED. And probably by EMS Patient asked that I discussed her case with Dr. Ladene Artist. I did and he has no problem with the Solu-Medrol and racemic epinephrine. He does state that usually the  infection is viral. She may need to have indirect pharyngitis could be performed but the next step would be to transfer her to the ED if she was unstable or not improving. He also states that sometimes medication for anxiety nurse can help and that she needed to be reminded to breathe through her nose and not to her mouth since pregnancy mouth irritates those dysfunctional vocal cords  She prefers to go home at this time she did notice improvement with the racemic epinephrine and the Solu-Medrol. Strep test is pending. If positive will place on amoxicillin if negative Ceftin 500 one tablet twice a day. Will also place on a six-day course of prednisone, Klonopin 0.5 mg 1 tablet twice a day when necessary for nerves. Patient also request something for cough but she is on Medicaid which will not cover cough medicines. She states she she will pay out of pocket and will give her a prescription for Tussionex. Work note for tomorrow as well. Recommended not better in the next 48-72 hours she will need to see her PCP for possible referral to ENT. If she gets worse in the next 24 hours she needs to go to the ED of her choice.      Frederich Cha, MD 05/15/15 [phone]

## 2015-05-17 LAB — CULTURE, GROUP A STREP (THRC)

## 2015-06-03 DIAGNOSIS — Z8614 Personal history of Methicillin resistant Staphylococcus aureus infection: Secondary | ICD-10-CM

## 2015-06-03 HISTORY — DX: Personal history of Methicillin resistant Staphylococcus aureus infection: Z86.14

## 2015-07-21 ENCOUNTER — Emergency Department
Admission: EM | Admit: 2015-07-21 | Discharge: 2015-07-21 | Disposition: A | Discharge status: Home / Self Care | Payer: Medicaid Other | Attending: Emergency Medicine | Admitting: Emergency Medicine

## 2015-07-21 ENCOUNTER — Encounter: Payer: Self-pay | Admitting: Emergency Medicine

## 2015-07-21 DIAGNOSIS — Z79899 Other long term (current) drug therapy: Secondary | ICD-10-CM | POA: Diagnosis not present

## 2015-07-21 DIAGNOSIS — Z792 Long term (current) use of antibiotics: Secondary | ICD-10-CM | POA: Diagnosis not present

## 2015-07-21 DIAGNOSIS — R220 Localized swelling, mass and lump, head: Secondary | ICD-10-CM | POA: Diagnosis present

## 2015-07-21 DIAGNOSIS — J0100 Acute maxillary sinusitis, unspecified: Secondary | ICD-10-CM | POA: Diagnosis not present

## 2015-07-21 DIAGNOSIS — Z791 Long term (current) use of non-steroidal anti-inflammatories (NSAID): Secondary | ICD-10-CM | POA: Insufficient documentation

## 2015-07-21 DIAGNOSIS — J340 Abscess, furuncle and carbuncle of nose: Secondary | ICD-10-CM | POA: Insufficient documentation

## 2015-07-21 HISTORY — DX: Unspecified osteoarthritis, unspecified site: M19.90

## 2015-07-21 HISTORY — DX: Other intervertebral disc displacement, lumbar region: M51.26

## 2015-07-21 HISTORY — DX: Other intervertebral disc degeneration, lumbar region without mention of lumbar back pain or lower extremity pain: M51.369

## 2015-07-21 HISTORY — DX: Other intervertebral disc degeneration, lumbar region: M51.36

## 2015-07-21 LAB — COMPREHENSIVE METABOLIC PANEL
ALBUMIN: 4.4 g/dL (ref 3.5–5.0)
ALK PHOS: 87 U/L (ref 38–126)
ALT: 10 U/L — AB (ref 14–54)
ANION GAP: 8 (ref 5–15)
AST: 18 U/L (ref 15–41)
BILIRUBIN TOTAL: 0.8 mg/dL (ref 0.3–1.2)
BUN: 12 mg/dL (ref 6–20)
CALCIUM: 9.2 mg/dL (ref 8.9–10.3)
CO2: 28 mmol/L (ref 22–32)
Chloride: 100 mmol/L — ABNORMAL LOW (ref 101–111)
Creatinine, Ser: 0.68 mg/dL (ref 0.44–1.00)
GFR calc Af Amer: >60 mL/min (ref >60)
GFR calc non Af Amer: >60 mL/min (ref >60)
GLUCOSE: 115 mg/dL — AB (ref 65–99)
Potassium: 3.3 mmol/L — ABNORMAL LOW (ref 3.5–5.1)
SODIUM: 136 mmol/L (ref 135–145)
TOTAL PROTEIN: 8 g/dL (ref 6.5–8.1)

## 2015-07-21 LAB — CBC
HCT: 36.6 % (ref 35.0–47.0)
HEMOGLOBIN: 12.6 g/dL (ref 12.0–16.0)
MCH: 30.4 pg (ref 26.0–34.0)
MCHC: 34.4 g/dL (ref 32.0–36.0)
MCV: 88.5 fL (ref 80.0–100.0)
Platelets: 273 10*3/uL (ref 150–440)
RBC: 4.13 MIL/uL (ref 3.80–5.20)
RDW: 12.9 % (ref 11.5–14.5)
WBC: 7.7 10*3/uL (ref 3.6–11.0)

## 2015-07-21 MED ORDER — KETOROLAC TROMETHAMINE 10 MG PO TABS: 10.0000 mg | ORAL_TABLET | Freq: Four times a day (QID) | ORAL | Status: DC | PRN

## 2015-07-21 MED ORDER — OXYCODONE-ACETAMINOPHEN 5-325 MG PO TABS
1.0000 | ORAL_TABLET | Freq: Once | ORAL | Status: AC
  Administered 2015-07-21: 1 via ORAL
  Filled 2015-07-21: qty 1

## 2015-07-21 MED ORDER — SULFAMETHOXAZOLE-TRIMETHOPRIM 800-160 MG PO TABS: 1.0000 | ORAL_TABLET | Freq: Two times a day (BID) | ORAL | Status: AC

## 2015-07-21 MED ORDER — SULFAMETHOXAZOLE-TRIMETHOPRIM 800-160 MG PO TABS
1.0000 | ORAL_TABLET | Freq: Once | ORAL | Status: AC
  Administered 2015-07-21: 1 via ORAL
  Filled 2015-07-21: qty 1

## 2015-07-21 MED ORDER — KETOROLAC TROMETHAMINE 10 MG PO TABS
10.0000 mg | ORAL_TABLET | Freq: Once | ORAL | Status: AC
  Administered 2015-07-21: 10 mg via ORAL
  Filled 2015-07-21: qty 1

## 2015-07-21 NOTE — ED Notes (Signed)
Pain medication given in triage. Patient advised about side effects of medications and to avoid driving for a minimum of 4 hours.

## 2015-07-21 NOTE — ED Notes (Signed)
Pt states she has swelling and pain with some bleeding from right nares today.  Using saline wash in nares for dryness.  Pt states she spoke with dr Gregary Signs and was advised to come to er for eval of staph infection.  No bleeding now.  Pt reports pain to right side of face.  Pt alert.  Speech clear.  No acute resp distress noted.

## 2015-07-21 NOTE — ED Provider Notes (Signed)
[facility] Emergency Department Provider Note  ____________________________________________  Time seen:   I have reviewed the triage vital signs and the nursing notes.   HISTORY  Chief Complaint Facial Swelling    HPI Summer Buckley is a 30 y.o. presents with left nasal pain since Tuesday and "yellow pus drainage" today patient states that she's been very congested and a such as been doing saline rinses. Patient states she spoke with Dr. Ronny Flurry her ENT doctor today who advised her to come to the emergency department as this may be a "staph infection". Patient denies any fever, no difficulty swallowing or breathing no nausea vomiting   Past Medical History  Diagnosis Date  . Endometriosis   . PTSD (post-traumatic stress disorder)   . ADHD (attention deficit hyperactivity disorder)   . Anxiety   . Chronic back pain   . Depression   . Headache   . Bulging lumbar disc   . Arthritis     There are no active problems to display for this patient.   Past Surgical History  Procedure Laterality Date  . Abdominal hysterectomy    . Appendectomy    . Tonsillectomy    . Back surgery      Current Outpatient Rx  Name  Route  Sig  Dispense  Refill  . amphetamine-dextroamphetamine (ADDERALL) 20 MG tablet   Oral   Take 20 mg by mouth 2 (two) times daily.         Marland Kitchen buPROPion (WELLBUTRIN SR) 200 MG 12 hr tablet   Oral   Take 200 mg by mouth every morning.         . butalbital-acetaminophen-caffeine (FIORICET) 50-325-40 MG per tablet   Oral   Take 1-2 tablets by mouth every 6 (six) hours as needed for headache.   12 tablet   0   . cefUROXime (CEFTIN) 500 MG tablet   Oral   Take 1 tablet (500 mg total) by mouth 2 (two) times daily.   20 tablet   0   . cephALEXin (KEFLEX) 500 MG capsule   Oral   Take 1 capsule (500 mg total) by mouth 3 (three) times daily.   30 capsule   0   . chlorpheniramine-HYDROcodone (TUSSIONEX PENNKINETIC ER) 10-8  MG/5ML SUER   Oral   Take 5 mLs by mouth every 12 (twelve) hours as needed for cough.   115 mL   0   . clonazePAM (KLONOPIN) 0.5 MG tablet   Oral   Take 1 tablet (0.5 mg total) by mouth 2 (two) times daily as needed for anxiety.   30 tablet   0   . cyclobenzaprine (FLEXERIL) 5 MG tablet   Oral   Take 5 mg by mouth 3 (three) times daily.         . diazepam (VALIUM) 10 MG tablet   Oral   Take 10 mg by mouth 3 (three) times daily.         . diclofenac (VOLTAREN) 75 MG EC tablet   Oral   Take 75 mg by mouth 2 (two) times daily.         Marland Kitchen ketorolac (TORADOL) 10 MG tablet   Oral   Take 1 tablet (10 mg total) by mouth every 6 (six) hours as needed.   20 tablet   0   . oxyCODONE (OXY IR/ROXICODONE) 5 MG immediate release tablet   Oral   Take 5-10 mg by mouth every 4 (four) hours. Every 4 to 6  hours         . phenazopyridine (PYRIDIUM) 200 MG tablet   Oral   Take 1 tablet (200 mg total) by mouth 3 (three) times daily as needed for pain.   20 tablet   0   . predniSONE (STERAPRED UNI-PAK 21 TAB) 10 MG (21) TBPK tablet      Sig 6 tablet day 1, 5 tablets day 2, 4 tablets day 3,,3tablets day 4, 2 tablets day 5, 1 tablet day 6 take all tablets orally   21 tablet   0   . pregabalin (LYRICA) 75 MG capsule   Oral   Take 75-150 mg by mouth 2 (two) times daily. 1 capsule every morning, 2 capsules at bedtime         . sulfamethoxazole-trimethoprim (BACTRIM DS,SEPTRA DS) 800-160 MG tablet   Oral   Take 1 tablet by mouth 2 (two) times daily.   20 tablet   0     Allergies Tape  No family history on file.  Social History Social History  Substance Use Topics  . Smoking status: Never Smoker   . Smokeless tobacco: None  . Alcohol Use: No    Review of Systems  Constitutional: Negative for fever. Eyes: Negative for visual changes. ENT: Negative for sore throat. Positive for right sidepain and swelling Cardiovascular: Negative for chest pain. Respiratory:  Negative for shortness of breath. Gastrointestinal: Negative for abdominal pain, vomiting and diarrhea. Genitourinary: Negative for dysuria. Musculoskeletal: Negative for back pain. Skin: Negative for rash. Neurological: Negative for headaches, focal weakness or numbness.   10-point ROS otherwise negative.  ____________________________________________   PHYSICAL EXAM:  VITAL SIGNS: ED Triage Vitals  Enc Vitals Group     BP 07/21/15 0309 109/87 mmHg     Pulse Rate 07/21/15 0308 110     Resp 07/21/15 0308 18     Temp 07/21/15 0308 97.5 F (36.4 C)     Temp src --      SpO2 07/21/15 0308 100 %     Weight 07/21/15 0308 123 lb (55.792 kg)     Height 07/21/15 0308 5\' 5"  (1.651 m)     Head Cir --      Peak Flow --      Pain Score 07/21/15 0309 8     Pain Loc --      Pain Edu? --      Excl. in [place]? --     Constitutional: Alert and oriented. Well appearing and in no distress. Eyes: Conjunctivae are normal. PERRL. Normal extraocular movements. ENT   Head: Normocephalic and atraumatic.   Nose: Right nare  congestion and polyp versus abscess on the lateral wall. No drainage appreciated at this time. Pain with percussion of the maxillary sinuses   Mouth/Throat: Mucous membranes are moist.   Neck: No stridor. Hematological/Lymphatic/Immunilogical: No cervical lymphadenopathy. Cardiovascular: Normal rate, regular rhythm. Normal and symmetric distal pulses are present in all extremities. No murmurs, rubs, or gallops. Respiratory: Normal respiratory effort without tachypnea nor retractions. Breath sounds are clear and equal bilaterally. No wheezes/rales/rhonchi. Gastrointestinal: Soft and nontender. No distention. There is no CVA tenderness. Genitourinary: deferred Musculoskeletal: Nontender with normal range of motion in all extremities. No joint effusions.  No lower extremity tenderness nor edema. Neurologic:  Normal speech and language. No gross focal neurologic deficits  are appreciated. Speech is normal.  Skin:  Skin is warm, dry and intact. No rash noted. Psychiatric: Mood and affect are normal. Speech and behavior are normal. Patient exhibits  appropriate insight and judgment.    INITIAL IMPRESSION / ASSESSMENT AND PLAN / ED COURSE  Pertinent labs & imaging results that were available during my care of the patient were reviewed by me and considered in my medical decision making (see chart for details). She received 2 Percocets and Toradol emergency department as well as Bactrim DS History of physical exam consistent with possible right nare draining abscess with sinusitis. Patient prescribed Bactrim DS here in the emergency department will be given the same for home  ____________________________________________   FINAL CLINICAL IMPRESSION(S) / ED DIAGNOSES  Final diagnoses:  Acute maxillary sinusitis, recurrence not specified  Abscess of nasal cavity      Gregor Hams, MD 07/21/15 [phone]

## 2015-07-21 NOTE — Discharge Instructions (Signed)
Abscess An abscess is an infected area that contains a collection of pus and debris.It can occur in almost any part of the body. An abscess is also known as a furuncle or boil. CAUSES  An abscess occurs when tissue gets infected. This can occur from blockage of oil or sweat glands, infection of hair follicles, or a minor injury to the skin. As the body tries to fight the infection, pus collects in the area and creates pressure under the skin. This pressure causes pain. People with weakened immune systems have difficulty fighting infections and get certain abscesses more often.  SYMPTOMS Usually an abscess develops on the skin and becomes a painful mass that is red, warm, and tender. If the abscess forms under the skin, you may feel a moveable soft area under the skin. Some abscesses break open (rupture) on their own, but most will continue to get worse without care. The infection can spread deeper into the body and eventually into the bloodstream, causing you to feel ill.  DIAGNOSIS  Your caregiver will take your medical history and perform a physical exam. A sample of fluid may also be taken from the abscess to determine what is causing your infection. TREATMENT  Your caregiver may prescribe antibiotic medicines to fight the infection. However, taking antibiotics alone usually does not cure an abscess. Your caregiver may need to make a small cut (incision) in the abscess to drain the pus. In some cases, gauze is packed into the abscess to reduce pain and to continue draining the area. HOME CARE INSTRUCTIONS   Only take over-the-counter or prescription medicines for pain, discomfort, or fever as directed by your caregiver.  If you were prescribed antibiotics, take them as directed. Finish them even if you start to feel better.  If gauze is used, follow your caregiver's directions for changing the gauze.  To avoid spreading the infection:  Keep your draining abscess covered with a  bandage.  Wash your hands well.  Do not share personal care items, towels, or whirlpools with others.  Avoid skin contact with others.  Keep your skin and clothes clean around the abscess.  Keep all follow-up appointments as directed by your caregiver. SEEK MEDICAL CARE IF:   You have increased pain, swelling, redness, fluid drainage, or bleeding.  You have muscle aches, chills, or a general ill feeling.  You have a fever. MAKE SURE YOU:   Understand these instructions.  Will watch your condition.  Will get help right away if you are not doing well or get worse.   This information is not intended to replace advice given to you by your health care provider. Make sure you discuss any questions you have with your health care provider.   Document Released: 02/26/2005 Document Revised: 11/18/2011 Document Reviewed: 08/01/2011 Elsevier Interactive Patient Education 2016 [employer].  Sinusitis, Adult Sinusitis is redness, soreness, and inflammation of the paranasal sinuses. Paranasal sinuses are air pockets within the bones of your face. They are located beneath your eyes, in the middle of your forehead, and above your eyes. In healthy paranasal sinuses, mucus is able to drain out, and air is able to circulate through them by way of your nose. However, when your paranasal sinuses are inflamed, mucus and air can become trapped. This can allow bacteria and other germs to grow and cause infection. Sinusitis can develop quickly and last only a short time (acute) or continue over a long period (chronic). Sinusitis that lasts for more than 12 weeks is  considered chronic. CAUSES Causes of sinusitis include:  Allergies.  Structural abnormalities, such as displacement of the cartilage that separates your nostrils (deviated septum), which can decrease the air flow through your nose and sinuses and affect sinus drainage.  Functional abnormalities, such as when the small hairs (cilia) that  line your sinuses and help remove mucus do not work properly or are not present. SIGNS AND SYMPTOMS Symptoms of acute and chronic sinusitis are the same. The primary symptoms are pain and pressure around the affected sinuses. Other symptoms include:  Upper toothache.  Earache.  Headache.  Bad breath.  Decreased sense of smell and taste.  A cough, which worsens when you are lying flat.  Fatigue.  Fever.  Thick drainage from your nose, which often is green and may contain pus (purulent).  Swelling and warmth over the affected sinuses. DIAGNOSIS Your health care provider will perform a physical exam. During your exam, your health care provider may perform any of the following to help determine if you have acute sinusitis or chronic sinusitis:  Look in your nose for signs of abnormal growths in your nostrils (nasal polyps).  Tap over the affected sinus to check for signs of infection.  View the inside of your sinuses using an imaging device that has a light attached (endoscope). If your health care provider suspects that you have chronic sinusitis, one or more of the following tests may be recommended:  Allergy tests.  Nasal culture. A sample of mucus is taken from your nose, sent to a lab, and screened for bacteria.  Nasal cytology. A sample of mucus is taken from your nose and examined by your health care provider to determine if your sinusitis is related to an allergy. TREATMENT Most cases of acute sinusitis are related to a viral infection and will resolve on their own within 10 days. Sometimes, medicines are prescribed to help relieve symptoms of both acute and chronic sinusitis. These may include pain medicines, decongestants, nasal steroid sprays, or saline sprays. However, for sinusitis related to a bacterial infection, your health care provider will prescribe antibiotic medicines. These are medicines that will help kill the bacteria causing the infection. Rarely,  sinusitis is caused by a fungal infection. In these cases, your health care provider will prescribe antifungal medicine. For some cases of chronic sinusitis, surgery is needed. Generally, these are cases in which sinusitis recurs more than 3 times per year, despite other treatments. HOME CARE INSTRUCTIONS  Drink plenty of water. Water helps thin the mucus so your sinuses can drain more easily.  Use a humidifier.  Inhale steam 3-4 times a day (for example, sit in the bathroom with the shower running).  Apply a warm, moist washcloth to your face 3-4 times a day, or as directed by your health care provider.  Use saline nasal sprays to help moisten and clean your sinuses.  Take medicines only as directed by your health care provider.  If you were prescribed either an antibiotic or antifungal medicine, finish it all even if you start to feel better. SEEK IMMEDIATE MEDICAL CARE IF:  You have increasing pain or severe headaches.  You have nausea, vomiting, or drowsiness.  You have swelling around your face.  You have vision problems.  You have a stiff neck.  You have difficulty breathing.   This information is not intended to replace advice given to you by your health care provider. Make sure you discuss any questions you have with your health care provider.  Document Released: 05/19/2005 Document Revised: 06/09/2014 Document Reviewed: 06/03/2011 Elsevier Interactive Patient Education [employer].

## 2015-07-21 NOTE — ED Notes (Signed)
Patient states that she developed swelling to right nare Tuesday. Patient reports that the swelling and pain had become worse. Patient reports that she had yellow pus draining from her nose. Patient states that she spoke with her emt and he told her to come to the ed.

## 2015-08-09 DIAGNOSIS — R7982 Elevated C-reactive protein (CRP): Secondary | ICD-10-CM | POA: Insufficient documentation

## 2015-08-09 DIAGNOSIS — G8929 Other chronic pain: Secondary | ICD-10-CM | POA: Insufficient documentation

## 2015-09-12 ENCOUNTER — Emergency Department
Admission: EM | Admit: 2015-09-12 | Discharge: 2015-09-12 | Disposition: A | Discharge status: Home / Self Care | Payer: No Typology Code available for payment source | Attending: Emergency Medicine | Admitting: Emergency Medicine

## 2015-09-12 ENCOUNTER — Emergency Department: Payer: No Typology Code available for payment source

## 2015-09-12 ENCOUNTER — Encounter: Payer: Self-pay | Admitting: Emergency Medicine

## 2015-09-12 DIAGNOSIS — F909 Attention-deficit hyperactivity disorder, unspecified type: Secondary | ICD-10-CM | POA: Diagnosis not present

## 2015-09-12 DIAGNOSIS — F329 Major depressive disorder, single episode, unspecified: Secondary | ICD-10-CM | POA: Diagnosis not present

## 2015-09-12 DIAGNOSIS — S161XXA Strain of muscle, fascia and tendon at neck level, initial encounter: Secondary | ICD-10-CM | POA: Diagnosis not present

## 2015-09-12 DIAGNOSIS — M542 Cervicalgia: Secondary | ICD-10-CM | POA: Diagnosis present

## 2015-09-12 DIAGNOSIS — M1990 Unspecified osteoarthritis, unspecified site: Secondary | ICD-10-CM | POA: Insufficient documentation

## 2015-09-12 DIAGNOSIS — Y939 Activity, unspecified: Secondary | ICD-10-CM | POA: Insufficient documentation

## 2015-09-12 DIAGNOSIS — Y92410 Unspecified street and highway as the place of occurrence of the external cause: Secondary | ICD-10-CM | POA: Diagnosis not present

## 2015-09-12 DIAGNOSIS — R109 Unspecified abdominal pain: Secondary | ICD-10-CM

## 2015-09-12 DIAGNOSIS — Y999 Unspecified external cause status: Secondary | ICD-10-CM | POA: Insufficient documentation

## 2015-09-12 DIAGNOSIS — R1084 Generalized abdominal pain: Secondary | ICD-10-CM | POA: Diagnosis not present

## 2015-09-12 DIAGNOSIS — M545 Low back pain, unspecified: Secondary | ICD-10-CM

## 2015-09-12 LAB — CBC WITH DIFFERENTIAL/PLATELET
BASOS ABS: 0 10*3/uL (ref 0–0.1)
BASOS PCT: 1 %
EOS ABS: 0.1 10*3/uL (ref 0–0.7)
Eosinophils Relative: 2 %
HCT: 39.1 % (ref 35.0–47.0)
Hemoglobin: 13.2 g/dL (ref 12.0–16.0)
Lymphocytes Relative: 42 %
Lymphs Abs: 2.5 10*3/uL (ref 1.0–3.6)
MCH: 29.9 pg (ref 26.0–34.0)
MCHC: 33.8 g/dL (ref 32.0–36.0)
MCV: 88.5 fL (ref 80.0–100.0)
MONO ABS: 0.3 10*3/uL (ref 0.2–0.9)
MONOS PCT: 6 %
Neutro Abs: 2.9 10*3/uL (ref 1.4–6.5)
Neutrophils Relative %: 49 %
PLATELETS: 276 10*3/uL (ref 150–440)
RBC: 4.41 MIL/uL (ref 3.80–5.20)
RDW: 13 % (ref 11.5–14.5)
WBC: 5.9 10*3/uL (ref 3.6–11.0)

## 2015-09-12 LAB — COMPREHENSIVE METABOLIC PANEL
ALBUMIN: 4.5 g/dL (ref 3.5–5.0)
ALT: 13 U/L — ABNORMAL LOW (ref 14–54)
ANION GAP: 6 (ref 5–15)
AST: 18 U/L (ref 15–41)
Alkaline Phosphatase: 69 U/L (ref 38–126)
BUN: 19 mg/dL (ref 6–20)
CHLORIDE: 105 mmol/L (ref 101–111)
CO2: 26 mmol/L (ref 22–32)
Calcium: 9.2 mg/dL (ref 8.9–10.3)
Creatinine, Ser: 0.63 mg/dL (ref 0.44–1.00)
GFR calc Af Amer: >60 mL/min (ref >60)
GFR calc non Af Amer: >60 mL/min (ref >60)
GLUCOSE: 117 mg/dL — AB (ref 65–99)
POTASSIUM: 3.5 mmol/L (ref 3.5–5.1)
SODIUM: 137 mmol/L (ref 135–145)
TOTAL PROTEIN: 7.4 g/dL (ref 6.5–8.1)
Total Bilirubin: 0.8 mg/dL (ref 0.3–1.2)

## 2015-09-12 LAB — TYPE AND SCREEN
ABO/RH(D): O POS
Antibody Screen: NEGATIVE

## 2015-09-12 MED ORDER — IOPAMIDOL (ISOVUE-370) INJECTION 76%
100.0000 mL | Freq: Once | INTRAVENOUS | Status: AC | PRN
  Administered 2015-09-12: 100 mL via INTRAVENOUS
  Filled 2015-09-12: qty 100

## 2015-09-12 MED ORDER — OXYCODONE-ACETAMINOPHEN 5-325 MG PO TABS
1.0000 | ORAL_TABLET | Freq: Once | ORAL | Status: AC
  Administered 2015-09-12: 1 via ORAL
  Filled 2015-09-12: qty 1

## 2015-09-12 MED ORDER — MORPHINE SULFATE (PF) 4 MG/ML IV SOLN
4.0000 mg | Freq: Once | INTRAVENOUS | Status: AC
  Administered 2015-09-12: 4 mg via INTRAVENOUS
  Filled 2015-09-12: qty 1

## 2015-09-12 MED ORDER — HYDROCODONE-ACETAMINOPHEN 5-325 MG PO TABS: 1.0000 | ORAL_TABLET | ORAL | Status: DC | PRN

## 2015-09-12 MED ORDER — ONDANSETRON HCL 4 MG/2ML IJ SOLN
4.0000 mg | Freq: Once | INTRAMUSCULAR | Status: AC
  Administered 2015-09-12: 4 mg via INTRAVENOUS
  Filled 2015-09-12: qty 2

## 2015-09-12 MED ORDER — BACLOFEN 10 MG PO TABS: 10.0000 mg | ORAL_TABLET | Freq: Three times a day (TID) | ORAL | Status: DC
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
## 2015-09-12 NOTE — Discharge Instructions (Signed)

## 2015-09-13 LAB — ABO/RH: ABO/RH(D): O POS

## 2015-09-18 ENCOUNTER — Ambulatory Visit
Admission: EM | Admit: 2015-09-18 | Discharge: 2015-09-18 | Disposition: A | Discharge status: Home / Self Care | Payer: Medicaid Other | Attending: Family Medicine | Admitting: Family Medicine

## 2015-09-18 ENCOUNTER — Encounter: Payer: Self-pay | Admitting: Emergency Medicine

## 2015-09-18 ENCOUNTER — Ambulatory Visit: Payer: Medicaid Other

## 2015-09-18 DIAGNOSIS — M797 Fibromyalgia: Secondary | ICD-10-CM | POA: Diagnosis not present

## 2015-09-18 DIAGNOSIS — F909 Attention-deficit hyperactivity disorder, unspecified type: Secondary | ICD-10-CM | POA: Diagnosis not present

## 2015-09-18 DIAGNOSIS — F419 Anxiety disorder, unspecified: Secondary | ICD-10-CM | POA: Diagnosis not present

## 2015-09-18 DIAGNOSIS — F4310 Post-traumatic stress disorder, unspecified: Secondary | ICD-10-CM | POA: Insufficient documentation

## 2015-09-18 DIAGNOSIS — S46911A Strain of unspecified muscle, fascia and tendon at shoulder and upper arm level, right arm, initial encounter: Secondary | ICD-10-CM | POA: Diagnosis not present

## 2015-09-18 DIAGNOSIS — F329 Major depressive disorder, single episode, unspecified: Secondary | ICD-10-CM | POA: Insufficient documentation

## 2015-09-18 DIAGNOSIS — G8929 Other chronic pain: Secondary | ICD-10-CM | POA: Diagnosis not present

## 2015-09-18 DIAGNOSIS — S161XXA Strain of muscle, fascia and tendon at neck level, initial encounter: Secondary | ICD-10-CM | POA: Insufficient documentation

## 2015-09-18 DIAGNOSIS — M1990 Unspecified osteoarthritis, unspecified site: Secondary | ICD-10-CM | POA: Insufficient documentation

## 2015-09-18 DIAGNOSIS — M542 Cervicalgia: Secondary | ICD-10-CM | POA: Diagnosis present

## 2015-09-18 HISTORY — DX: Fibromyalgia: M79.7

## 2015-09-18 MED ORDER — METAXALONE 800 MG PO TABS: 800.0000 mg | ORAL_TABLET | Freq: Three times a day (TID) | ORAL | Status: DC

## 2015-09-18 MED ORDER — KETOROLAC TROMETHAMINE 60 MG/2ML IM SOLN
60.0000 mg | Freq: Once | INTRAMUSCULAR | Status: AC
  Administered 2015-09-18: 60 mg via INTRAMUSCULAR
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
## 2015-09-18 NOTE — Discharge Instructions (Signed)
Cervical Sprain °A cervical sprain is an injury in the neck in which the strong, fibrous tissues (ligaments) that connect your neck bones stretch or tear. Cervical sprains can range from mild to severe. Severe cervical sprains can cause the neck vertebrae to be unstable. This can lead to damage of the spinal cord and can result in serious nervous system problems. The amount of time it takes for a cervical sprain to get better depends on the cause and extent of the injury. Most cervical sprains heal in 1 to 3 weeks. °CAUSES  °Severe cervical sprains may be caused by:  °· Contact sport injuries (such as from football, rugby, wrestling, hockey, auto racing, gymnastics, diving, martial arts, or boxing).   °· Motor vehicle collisions.   °· Whiplash injuries. This is an injury from a sudden forward and backward whipping movement of the head and neck.  °· Falls.   °Mild cervical sprains may be caused by:  °· Being in an awkward position, such as while cradling a telephone between your ear and shoulder.   °· Sitting in a chair that does not offer proper support.   °· Working at a poorly designed computer station.   °· Looking up or down for long periods of time.   °SYMPTOMS  °· Pain, soreness, stiffness, or a burning sensation in the front, back, or sides of the neck. This discomfort may develop immediately after the injury or slowly, 24 hours or more after the injury.   °· Pain or tenderness directly in the middle of the back of the neck.   °· Shoulder or upper back pain.   °· Limited ability to move the neck.   °· Headache.   °· Dizziness.   °· Weakness, numbness, or tingling in the hands or arms.   °· Muscle spasms.   °· Difficulty swallowing or chewing.   °· Tenderness and swelling of the neck.   °DIAGNOSIS  °Most of the time your health care provider can diagnose a cervical sprain by taking your history and doing a physical exam. Your health care provider will ask about previous neck injuries and any known neck  problems, such as arthritis in the neck. X-rays may be taken to find out if there are any other problems, such as with the bones of the neck. Other tests, such as a CT scan or MRI, may also be needed.  °TREATMENT  °Treatment depends on the severity of the cervical sprain. Mild sprains can be treated with rest, keeping the neck in place (immobilization), and pain medicines. Severe cervical sprains are immediately immobilized. Further treatment is done to help with pain, muscle spasms, and other symptoms and may include: °· Medicines, such as pain relievers, numbing medicines, or muscle relaxants.   °· Physical therapy. This may involve stretching exercises, strengthening exercises, and posture training. Exercises and improved posture can help stabilize the neck, strengthen muscles, and help stop symptoms from returning.   °HOME CARE INSTRUCTIONS  °· Put ice on the injured area.   °¨ Put ice in a plastic bag.   °¨ Place a towel between your skin and the bag.   °¨ Leave the ice on for 15-20 minutes, 3-4 times a day.   °· If your injury was severe, you may have been given a cervical collar to wear. A cervical collar is a two-piece collar designed to keep your neck from moving while it heals. °¨ Do not remove the collar unless instructed by your health care provider. °¨ If you have long hair, keep it outside of the collar. °¨ Ask your health care provider before making any adjustments to your collar. Minor   adjustments may be required over time to improve comfort and reduce pressure on your chin or on the back of your head.  Ifyou are allowed to remove the collar for cleaning or bathing, follow your health care provider's instructions on how to do so safely.  Keep your collar clean by wiping it with mild soap and water and drying it completely. If the collar you have been given includes removable pads, remove them every 1-2 days and hand wash them with soap and water. Allow them to air dry. They should be completely  dry before you wear them in the collar.  If you are allowed to remove the collar for cleaning and bathing, wash and dry the skin of your neck. Check your skin for irritation or sores. If you see any, tell your health care provider.  Do not drive while wearing the collar.   Only take over-the-counter or prescription medicines for pain, discomfort, or fever as directed by your health care provider.   Keep all follow-up appointments as directed by your health care provider.   Keep all physical therapy appointments as directed by your health care provider.   Make any needed adjustments to your workstation to promote good posture.   Avoid positions and activities that make your symptoms worse.   Warm up and stretch before being active to help prevent problems.  SEEK MEDICAL CARE IF:   Your pain is not controlled with medicine.   You are unable to decrease your pain medicine over time as planned.   Your activity level is not improving as expected.  SEEK IMMEDIATE MEDICAL CARE IF:   You develop any bleeding.  You develop stomach upset.  You have signs of an allergic reaction to your medicine.   Your symptoms get worse.   You develop new, unexplained symptoms.   You have numbness, tingling, weakness, or paralysis in any part of your body.  MAKE SURE YOU:   Understand these instructions.  Will watch your condition.  Will get help right away if you are not doing well or get worse.   This information is not intended to replace advice given to you by your health care provider. Make sure you discuss any questions you have with your health care provider.   Document Released: 03/16/2007 Document Revised: 05/24/2013 Document Reviewed: 11/24/2012 Elsevier Interactive Patient Education 2016 [employer].  Muscle Strain A muscle strain is an injury that occurs when a muscle is stretched beyond its normal length. Usually a small number of muscle fibers are torn when this  happens. Muscle strain is rated in degrees. First-degree strains have the least amount of muscle fiber tearing and pain. Second-degree and third-degree strains have increasingly more tearing and pain.  Usually, recovery from muscle strain takes 1-2 weeks. Complete healing takes 5-6 weeks.  CAUSES  Muscle strain happens when a sudden, violent force placed on a muscle stretches it too far. This may occur with lifting, sports, or a fall.  RISK FACTORS Muscle strain is especially common in athletes.  SIGNS AND SYMPTOMS At the site of the muscle strain, there may be:  Pain.  Bruising.  Swelling.  Difficulty using the muscle due to pain or lack of normal function. DIAGNOSIS  Your health care provider will perform a physical exam and ask about your medical history. TREATMENT  Often, the best treatment for a muscle strain is resting, icing, and applying cold compresses to the injured area.  HOME CARE INSTRUCTIONS   Use the PRICE method of  treatment to promote muscle healing during the first 2-3 days after your injury. The PRICE method involves:  Protecting the muscle from being injured again.  Restricting your activity and resting the injured body part.  Icing your injury. To do this, put ice in a plastic bag. Place a towel between your skin and the bag. Then, apply the ice and leave it on from 15-20 minutes each hour. After the third day, switch to moist heat packs.  Apply compression to the injured area with a splint or elastic bandage. Be careful not to wrap it too tightly. This may interfere with blood circulation or increase swelling.  Elevate the injured body part above the level of your heart as often as you can.  Only take over-the-counter or prescription medicines for pain, discomfort, or fever as directed by your health care provider.  Warming up prior to exercise helps to prevent future muscle strains. SEEK MEDICAL CARE IF:   You have increasing pain or swelling in the  injured area.  You have numbness, tingling, or a significant loss of strength in the injured area. MAKE SURE YOU:   Understand these instructions.  Will watch your condition.  Will get help right away if you are not doing well or get worse.   This information is not intended to replace advice given to you by your health care provider. Make sure you discuss any questions you have with your health care provider.   Document Released: 05/19/2005 Document Revised: 03/09/2013 Document Reviewed: 12/16/2012 Elsevier Interactive Patient Education [employer].
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
## 2016-02-07 ENCOUNTER — Emergency Department: Payer: Medicaid Other

## 2016-02-07 ENCOUNTER — Observation Stay
Admission: EM | Admit: 2016-02-07 | Discharge: 2016-02-08 | Disposition: A | Discharge status: Home / Self Care | Payer: Medicaid Other | Attending: Internal Medicine | Admitting: Internal Medicine

## 2016-02-07 ENCOUNTER — Encounter: Payer: Self-pay | Admitting: Internal Medicine

## 2016-02-07 DIAGNOSIS — Z9889 Other specified postprocedural states: Secondary | ICD-10-CM | POA: Insufficient documentation

## 2016-02-07 DIAGNOSIS — Z8249 Family history of ischemic heart disease and other diseases of the circulatory system: Secondary | ICD-10-CM | POA: Diagnosis not present

## 2016-02-07 DIAGNOSIS — M797 Fibromyalgia: Secondary | ICD-10-CM | POA: Diagnosis not present

## 2016-02-07 DIAGNOSIS — F4310 Post-traumatic stress disorder, unspecified: Secondary | ICD-10-CM | POA: Insufficient documentation

## 2016-02-07 DIAGNOSIS — Z90710 Acquired absence of both cervix and uterus: Secondary | ICD-10-CM | POA: Diagnosis not present

## 2016-02-07 DIAGNOSIS — F329 Major depressive disorder, single episode, unspecified: Secondary | ICD-10-CM | POA: Insufficient documentation

## 2016-02-07 DIAGNOSIS — Z79899 Other long term (current) drug therapy: Secondary | ICD-10-CM | POA: Insufficient documentation

## 2016-02-07 DIAGNOSIS — R061 Stridor: Secondary | ICD-10-CM

## 2016-02-07 DIAGNOSIS — M1990 Unspecified osteoarthritis, unspecified site: Secondary | ICD-10-CM | POA: Insufficient documentation

## 2016-02-07 DIAGNOSIS — R0602 Shortness of breath: Secondary | ICD-10-CM | POA: Diagnosis present

## 2016-02-07 DIAGNOSIS — Z791 Long term (current) use of non-steroidal anti-inflammatories (NSAID): Secondary | ICD-10-CM | POA: Insufficient documentation

## 2016-02-07 DIAGNOSIS — G8929 Other chronic pain: Secondary | ICD-10-CM | POA: Insufficient documentation

## 2016-02-07 DIAGNOSIS — F909 Attention-deficit hyperactivity disorder, unspecified type: Secondary | ICD-10-CM | POA: Insufficient documentation

## 2016-02-07 DIAGNOSIS — R0600 Dyspnea, unspecified: Secondary | ICD-10-CM | POA: Insufficient documentation

## 2016-02-07 DIAGNOSIS — F419 Anxiety disorder, unspecified: Secondary | ICD-10-CM | POA: Diagnosis not present

## 2016-02-07 DIAGNOSIS — R451 Restlessness and agitation: Secondary | ICD-10-CM | POA: Diagnosis not present

## 2016-02-07 DIAGNOSIS — M549 Dorsalgia, unspecified: Secondary | ICD-10-CM | POA: Insufficient documentation

## 2016-02-07 DIAGNOSIS — Z91048 Other nonmedicinal substance allergy status: Secondary | ICD-10-CM | POA: Insufficient documentation

## 2016-02-07 DIAGNOSIS — Z9049 Acquired absence of other specified parts of digestive tract: Secondary | ICD-10-CM | POA: Diagnosis not present

## 2016-02-07 DIAGNOSIS — Z8349 Family history of other endocrine, nutritional and metabolic diseases: Secondary | ICD-10-CM | POA: Diagnosis not present

## 2016-02-07 DIAGNOSIS — Z79891 Long term (current) use of opiate analgesic: Secondary | ICD-10-CM | POA: Diagnosis not present

## 2016-02-07 DIAGNOSIS — J383 Other diseases of vocal cords: Secondary | ICD-10-CM | POA: Diagnosis not present

## 2016-02-07 DIAGNOSIS — Z833 Family history of diabetes mellitus: Secondary | ICD-10-CM | POA: Insufficient documentation

## 2016-02-07 DIAGNOSIS — R0603 Acute respiratory distress: Secondary | ICD-10-CM

## 2016-02-07 DIAGNOSIS — E876 Hypokalemia: Secondary | ICD-10-CM | POA: Diagnosis not present

## 2016-02-07 LAB — COMPREHENSIVE METABOLIC PANEL
ALBUMIN: 4.5 g/dL (ref 3.5–5.0)
ALT: 13 U/L — ABNORMAL LOW (ref 14–54)
AST: 24 U/L (ref 15–41)
Alkaline Phosphatase: 89 U/L (ref 38–126)
Anion gap: 8 (ref 5–15)
BUN: 15 mg/dL (ref 6–20)
CHLORIDE: 103 mmol/L (ref 101–111)
CO2: 26 mmol/L (ref 22–32)
Calcium: 9.3 mg/dL (ref 8.9–10.3)
Creatinine, Ser: 0.62 mg/dL (ref 0.44–1.00)
GFR calc Af Amer: >60 mL/min (ref >60)
GLUCOSE: 118 mg/dL — AB (ref 65–99)
POTASSIUM: 3.2 mmol/L — AB (ref 3.5–5.1)
Sodium: 137 mmol/L (ref 135–145)
Total Bilirubin: 0.3 mg/dL (ref 0.3–1.2)
Total Protein: 7.8 g/dL (ref 6.5–8.1)

## 2016-02-07 LAB — CBC WITH DIFFERENTIAL/PLATELET
BASOS ABS: 0 10*3/uL (ref 0–0.1)
BASOS PCT: 1 %
EOS PCT: 9 %
Eosinophils Absolute: 0.6 10*3/uL (ref 0–0.7)
HCT: 39.1 % (ref 35.0–47.0)
Hemoglobin: 13.8 g/dL (ref 12.0–16.0)
Lymphocytes Relative: 31 %
Lymphs Abs: 2.2 10*3/uL (ref 1.0–3.6)
MCH: 30.5 pg (ref 26.0–34.0)
MCHC: 35.2 g/dL (ref 32.0–36.0)
MCV: 86.5 fL (ref 80.0–100.0)
MONO ABS: 0.5 10*3/uL (ref 0.2–0.9)
Monocytes Relative: 7 %
Neutro Abs: 3.6 10*3/uL (ref 1.4–6.5)
Neutrophils Relative %: 52 %
PLATELETS: 252 10*3/uL (ref 150–440)
RBC: 4.52 MIL/uL (ref 3.80–5.20)
RDW: 13.2 % (ref 11.5–14.5)
WBC: 7 10*3/uL (ref 3.6–11.0)

## 2016-02-07 LAB — MAGNESIUM: Magnesium: 2.1 mg/dL (ref 1.7–2.4)

## 2016-02-07 MED ORDER — POTASSIUM CHLORIDE 10 MEQ/100ML IV SOLN
10.0000 meq | INTRAVENOUS | Status: AC
  Filled 2016-02-07 (×3): qty 100

## 2016-02-07 MED ORDER — AMPHETAMINE-DEXTROAMPHET ER 30 MG PO CP24
30.0000 mg | ORAL_CAPSULE | Freq: Every day | ORAL | Status: DC
  Administered 2016-02-08: 30 mg via ORAL
  Filled 2016-02-07: qty 1

## 2016-02-07 MED ORDER — POTASSIUM CHLORIDE 10 MEQ/100ML IV SOLN
10.0000 meq | INTRAVENOUS | Status: AC
  Administered 2016-02-07: 10 meq via INTRAVENOUS
  Filled 2016-02-07 (×2): qty 100

## 2016-02-07 MED ORDER — FLUTICASONE PROPIONATE 50 MCG/ACT NA SUSP
1.0000 | Freq: Every day | NASAL | Status: DC
  Administered 2016-02-08: 1 via NASAL
  Filled 2016-02-07: qty 16

## 2016-02-07 MED ORDER — LORAZEPAM 2 MG/ML IJ SOLN
0.5000 mg | Freq: Once | INTRAMUSCULAR | Status: AC
  Administered 2016-02-07: 0.5 mg via INTRAVENOUS

## 2016-02-07 MED ORDER — CYCLOBENZAPRINE HCL 10 MG PO TABS
10.0000 mg | ORAL_TABLET | Freq: Three times a day (TID) | ORAL | Status: DC | PRN
  Administered 2016-02-07 – 2016-02-08 (×2): 10 mg via ORAL
  Filled 2016-02-07 (×2): qty 1

## 2016-02-07 MED ORDER — RACEPINEPHRINE HCL 2.25 % IN NEBU
INHALATION_SOLUTION | RESPIRATORY_TRACT | Status: AC
  Filled 2016-02-07: qty 0.5

## 2016-02-07 MED ORDER — BUPROPION HCL ER (XL) 150 MG PO TB24
150.0000 mg | ORAL_TABLET | Freq: Every day | ORAL | Status: DC
  Administered 2016-02-08: 150 mg via ORAL
  Filled 2016-02-07: qty 1

## 2016-02-07 MED ORDER — ESTROGENS CONJUGATED 0.625 MG PO TABS
1.2500 mg | ORAL_TABLET | Freq: Every day | ORAL | Status: DC
  Administered 2016-02-08: 1.25 mg via ORAL
  Filled 2016-02-07: qty 1
  Filled 2016-02-07: qty 2

## 2016-02-07 MED ORDER — LORAZEPAM 2 MG/ML IJ SOLN
INTRAMUSCULAR | Status: AC
  Filled 2016-02-07: qty 1

## 2016-02-07 MED ORDER — IPRATROPIUM-ALBUTEROL 0.5-2.5 (3) MG/3ML IN SOLN
RESPIRATORY_TRACT | Status: AC
  Filled 2016-02-07: qty 3

## 2016-02-07 MED ORDER — RACEPINEPHRINE HCL 2.25 % IN NEBU
0.5000 mL | INHALATION_SOLUTION | Freq: Once | RESPIRATORY_TRACT | Status: AC
  Administered 2016-02-07: 0.5 mL via RESPIRATORY_TRACT

## 2016-02-07 MED ORDER — SODIUM CHLORIDE 0.9 % IV SOLN
Freq: Once | INTRAVENOUS | Status: AC
  Administered 2016-02-07: 13:00:00 via INTRAVENOUS

## 2016-02-07 MED ORDER — ALPRAZOLAM 0.5 MG PO TABS
1.0000 mg | ORAL_TABLET | Freq: Four times a day (QID) | ORAL | Status: DC | PRN
  Administered 2016-02-07 – 2016-02-08 (×3): 1 mg via ORAL
  Filled 2016-02-07 (×3): qty 2

## 2016-02-07 MED ORDER — IPRATROPIUM-ALBUTEROL 0.5-2.5 (3) MG/3ML IN SOLN
3.0000 mL | Freq: Once | RESPIRATORY_TRACT | Status: AC
  Administered 2016-02-07: 3 mL via RESPIRATORY_TRACT

## 2016-02-07 MED ORDER — SODIUM CHLORIDE 0.9 % IV BOLUS (SEPSIS)
500.0000 mL | Freq: Once | INTRAVENOUS | Status: AC
Start: 2016-02-07 — End: 2016-02-07
  Administered 2016-02-07: 500 mL via INTRAVENOUS

## 2016-02-07 MED ORDER — TRAMADOL HCL 50 MG PO TABS
50.0000 mg | ORAL_TABLET | ORAL | Status: DC | PRN
  Administered 2016-02-07 – 2016-02-08 (×4): 50 mg via ORAL
  Filled 2016-02-07 (×4): qty 1

## 2016-02-07 MED ORDER — DEXAMETHASONE SODIUM PHOSPHATE 10 MG/ML IJ SOLN
10.0000 mg | Freq: Four times a day (QID) | INTRAMUSCULAR | Status: DC
  Administered 2016-02-07 – 2016-02-08 (×3): 10 mg via INTRAVENOUS
  Filled 2016-02-07 (×3): qty 1

## 2016-02-07 MED ORDER — ENOXAPARIN SODIUM 40 MG/0.4ML ~~LOC~~ SOLN
40.0000 mg | SUBCUTANEOUS | Status: DC
  Filled 2016-02-07: qty 0.4

## 2016-02-07 MED ORDER — MAGNESIUM SULFATE 2 GM/50ML IV SOLN
2.0000 g | Freq: Once | INTRAVENOUS | Status: AC
  Administered 2016-02-07: 2 g via INTRAVENOUS
  Filled 2016-02-07: qty 50

## 2016-02-07 MED ORDER — ACETAMINOPHEN 650 MG RE SUPP: 650.0000 mg | Freq: Four times a day (QID) | RECTAL | Status: DC | PRN

## 2016-02-07 MED ORDER — PANTOPRAZOLE SODIUM 40 MG PO TBEC
40.0000 mg | DELAYED_RELEASE_TABLET | Freq: Every day | ORAL | Status: DC
  Administered 2016-02-07 – 2016-02-08 (×2): 40 mg via ORAL
  Filled 2016-02-07 (×2): qty 1

## 2016-02-07 MED ORDER — BACLOFEN 10 MG PO TABS
10.0000 mg | ORAL_TABLET | Freq: Every day | ORAL | Status: DC
  Administered 2016-02-07: 10 mg via ORAL
  Filled 2016-02-07: qty 1

## 2016-02-07 MED ORDER — METHYLPREDNISOLONE SODIUM SUCC 125 MG IJ SOLR
125.0000 mg | Freq: Once | INTRAMUSCULAR | Status: AC
  Administered 2016-02-07: 125 mg via INTRAVENOUS
  Filled 2016-02-07: qty 2

## 2016-02-07 MED ORDER — ACETAMINOPHEN 325 MG PO TABS: 650.0000 mg | ORAL_TABLET | Freq: Four times a day (QID) | ORAL | Status: DC | PRN

## 2016-02-07 NOTE — ED Notes (Signed)
Consulting MD, Dr. Pryor Ochoa at bedside.

## 2016-02-07 NOTE — ED Triage Notes (Signed)
Pt presents with respiratory distress. 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 Pt with stridorous respirations.

## 2016-02-07 NOTE — H&P (Signed)
[place] [place] NAME: Summer Buckley    MR#:  [id number]  DATE OF BIRTH:  22-Jan-1986  DATE OF ADMISSION:  02/07/2016  PRIMARY CARE PHYSICIAN: [facility]   REQUESTING/REFERRING PHYSICIAN: Dr Girard Cooter  CHIEF COMPLAINT:   Chief Complaint  Patient presents with  . Respiratory Distress    HISTORY OF PRESENT ILLNESS:  Summer Buckley  is a 30 y.o. female presented with acute respiratory distress. Patient has a history of acute paroxysmal vocal cord closure. She presented today because she couldn't breathe she was wheezing coughing having chest pain and throat pain. She feels like she has a bronchitis. In the ER, she was placed on nonrebreather mask and ENT did a laryngoscope and epiglottis and airway were within normal limits. 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 They recommended overnight observation  PAST MEDICAL HISTORY:   Past Medical History:  Diagnosis Date  . ADHD (attention deficit hyperactivity disorder)   . Anxiety   . Arthritis   . Bulging lumbar disc   . Chronic back pain   . Depression   . Endometriosis   . Fibromyalgia   . Headache   . PTSD (post-traumatic stress disorder)     PAST SURGICAL HISTORY:   Past Surgical History:  Procedure Laterality Date  . ABDOMINAL HYSTERECTOMY    . APPENDECTOMY    . BACK SURGERY    . EXPLORATORY LAPAROTOMY    . TONSILLECTOMY      SOCIAL HISTORY:   Social History  Substance Use Topics  . Smoking status: Never Smoker  . Smokeless tobacco: Never Used  . Alcohol use No    FAMILY HISTORY:   Family History  Problem Relation Age of Onset  . Hypertension Mother   . Hyperlipidemia Mother   . Hypertension Father   . Hyperlipidemia Father   . Diabetes Father     DRUG ALLERGIES:   Allergies  Allergen Reactions  . Tape Itching and Rash    REVIEW OF SYSTEMS:  CONSTITUTIONAL: No fever, fatigue or weakness. Patient  trying to gain weight. Positive for chills. EYES: No blurred or double vision.  EARS, NOSE, AND THROAT: No tinnitus or ear pain. Positive for runny nose. Positive for sore throat. Positive for difficulty swallowing. RESPIRATORY: Positive for cough, shortness of breath, and wheezing. No hemoptysis.  CARDIOVASCULAR: Positive for chest pain, no orthopnea, edema.  GASTROINTESTINAL: No nausea, vomiting, diarrhea or abdominal pain. No blood in bowel movements. The other day had blood on the bowel movement. GENITOURINARY: No dysuria, hematuria.  ENDOCRINE: No polyuria, nocturia,  HEMATOLOGY: No anemia, easy bruising or bleeding SKIN: No rash or lesion. MUSCULOSKELETAL: No joint pain or arthritis.   NEUROLOGIC: No tingling, numbness, weakness. Positive for dizziness. PSYCHIATRY: History of anxiety or depression.   MEDICATIONS AT HOME:   Prior to Admission medications   Medication Sig Start Date End Date Taking? Authorizing Provider  ALPRAZolam Duanne Moron) 1 MG tablet Take 1 mg by mouth 4 (four) times daily.    Yes Historical Provider, MD  amphetamine-dextroamphetamine (ADDERALL) 30 MG tablet Take 30 mg by mouth daily.    Yes Historical Provider, MD  buPROPion (WELLBUTRIN SR) 200 MG 12 hr tablet Take 200 mg by mouth every morning.   Yes Historical Provider, MD  baclofen (LIORESAL) 10 MG tablet Take 1 tablet (10 mg total) by mouth 3 (three) times daily. 09/12/15 09/11/16  Victorino Dike, FNP  cefUROXime (CEFTIN) 500 MG tablet Take 1  tablet (500 mg total) by mouth 2 (two) times daily. 05/15/15   Frederich Cha, MD  cephALEXin (KEFLEX) 500 MG capsule Take 1 capsule (500 mg total) by mouth 3 (three) times daily. 02/09/15   Nance Pear, MD  chlorpheniramine-HYDROcodone (TUSSIONEX PENNKINETIC ER) 10-8 MG/5ML SUER Take 5 mLs by mouth every 12 (twelve) hours as needed for cough. 05/15/15   Frederich Cha, MD  clonazePAM (KLONOPIN) 0.5 MG tablet Take 1 tablet (0.5 mg total) by mouth 2 (two) times daily as needed for  anxiety. 05/15/15   Frederich Cha, MD  diazepam (VALIUM) 10 MG tablet Take 10 mg by mouth 3 (three) times daily.    Historical Provider, MD  diclofenac (VOLTAREN) 75 MG EC tablet Take 75 mg by mouth 2 (two) times daily.    Historical Provider, MD  gabapentin (NEURONTIN) 100 MG capsule Take 100 mg by mouth at bedtime.    Historical Provider, MD  HYDROcodone-acetaminophen (NORCO/VICODIN) 5-325 MG tablet Take 1 tablet by mouth every 4 (four) hours as needed for moderate pain. 09/12/15   Victorino Dike, FNP  metaxalone (SKELAXIN) 800 MG tablet Take 1 tablet (800 mg total) by mouth 3 (three) times daily. 09/18/15   Lorin Picket, PA-C  phenazopyridine (PYRIDIUM) 200 MG tablet Take 1 tablet (200 mg total) by mouth 3 (three) times daily as needed for pain. 02/09/15 02/09/16  Nance Pear, MD  predniSONE (STERAPRED UNI-PAK 21 TAB) 10 MG (21) TBPK tablet Sig 6 tablet day 1, 5 tablets day 2, 4 tablets day 3,,3tablets day 4, 2 tablets day 5, 1 tablet day 6 take all tablets orally 05/15/15   Frederich Cha, MD  pregabalin (LYRICA) 75 MG capsule Take 75-150 mg by mouth 2 (two) times daily. 1 capsule every morning, 2 capsules at bedtime    Historical Provider, MD    Medication reconciliation process still undergoing  VITAL SIGNS:  Blood pressure 123/78, pulse (!) 106, temperature 98.7 F (37.1 C), temperature source Axillary, resp. rate 15, weight 60.3 kg (133 lb), SpO2 100 %.  PHYSICAL EXAMINATION:  GENERAL:  30 y.o.-year-old patient lying in the bed with no acute distress.  EYES: Pupils equal, round, reactive to light and accommodation. No scleral icterus. Extraocular muscles intact.  HEENT: Head atraumatic, normocephalic. Oropharynx and nasopharynx clear.  NECK:  Supple, no jugular venous distention. No thyroid enlargement, no tenderness.  LUNGS: Normal breath sounds bilaterally, Patient coughs with deep breath. no wheezing, rales,rhonchi or crepitation. No use of accessory muscles of respiration.   CARDIOVASCULAR: S1, S2 tachycardia. No murmurs, rubs, or gallops.  ABDOMEN: Soft, nontender, nondistended. Bowel sounds present. No organomegaly or mass.  EXTREMITIES: No pedal edema, cyanosis, or clubbing.  NEUROLOGIC: Cranial nerves II through XII are intact. Muscle strength 5/5 in all extremities. Sensation intact. Gait not checked.  PSYCHIATRIC: The patient is alert and oriented x 3.  SKIN: No rash, lesion, or ulcer.   LABORATORY PANEL:   CBC  Recent Labs Lab 02/07/16 1216  WBC 7.0  HGB 13.8  HCT 39.1  PLT 252   ------------------------------------------------------------------------------------------------------------------  Chemistries   Recent Labs Lab 02/07/16 1216  NA 137  K 3.2*  CL 103  CO2 26  GLUCOSE 118*  BUN 15  CREATININE 0.62  CALCIUM 9.3  AST 24  ALT 13*  ALKPHOS 89  BILITOT 0.3   ------------------------------------------------------------------------------------------------------------------    RADIOLOGY:  Dg Neck Soft Tissue  Result Date: 02/07/2016 CLINICAL DATA:  Stridor today.  The patient is unable to speak. EXAM: NECK SOFT TISSUES -  1+ VIEW COMPARISON:  CT cervical spine 09/12/2015. FINDINGS: There is no evidence of retropharyngeal soft tissue swelling or epiglottic enlargement. The cervical airway is unremarkable and no radio-opaque foreign body identified. IMPRESSION: Negative exam. Electronically Signed   By: Inge Rise M.D.   On: 02/07/2016 12:35   Dg Chest Portable 1 View  Result Date: 02/07/2016 CLINICAL DATA:  Stridor.  12/25/2014 . EXAM: PORTABLE CHEST 1 VIEW COMPARISON:  12/25/2014. FINDINGS: Mediastinum and hilar structures normal. Cardiomegaly with normal pulmonary vascularity. No focal infiltrate. No pleural effusion or pneumothorax. Stable tiny calcified pulmonary nodules noted consistent granulomas. No acute bony abnormality . IMPRESSION: 1.  Cardiomegaly.  No evidence of congestive heart failure. 2. Evidence of prior  granulomas disease. No acute pulmonary disease. Electronically Signed   By: Marcello Moores  Register   On: 02/07/2016 12:37    EKG:   Not done  IMPRESSION AND PLAN:   1. Acute respiratory distress on presentation secondary to paradoxical closure of the vocal cords. As per ER physician seen by ENT already. Will observe and give IV steroids and monitor. Close clinical monitoring needed for respiratory status. 2. Anxiety and depression continue psychiatric medications 3. Chronic pain. Continue muscle relaxants and tramadol 4. Hypokalemia. Replace magnesium IV and potassium IV   All the records are reviewed and case discussed with ED provider. Management plans discussed with the patient, family and they are in agreement.  CODE STATUS: Full code  TOTAL TIME TAKING CARE OF THIS PATIENT: 50 minutes.    [name] M.D on 02/07/2016 at 2:57 PM  Between 7am to 6pm - Pager - [phone]  After 6pm call admission pager [phone]  [facility] Office  [phone]  CC: Primary care physician; [place]

## 2016-02-07 NOTE — ED Notes (Signed)
Pt placed on NRB at 15L.

## 2016-02-07 NOTE — Progress Notes (Signed)
Pt refused 2nd run of IV potassium. MD notified. No new orders, will await AM lab results.

## 2016-02-07 NOTE — Consult Note (Signed)
..   Summer Buckley, [place] [id number] [birth year] Summer Grayer, MD  Reason for Consult: Respiratory distress  HPI: [id number] Paged down to Emergency room at 12:32 p.m. For evaluation of airway for respiratory distress.  Patient has history of paradoxical vocal cord movements and intermittent respiratory issues.  Presented to ED with biphasic stridor and throat pain.  Lateral neck x-ray pending but due to respiratory concerns asked to evaluation airway.  Patient reports respiratory distress but able to phonate and reports has had episodes similar in past requiring hospitalizations.  Reports has anxiety issues and uses Xanax prn as well.  On muscle relaxers.  No fever.  Allergies:  Allergies  Allergen Reactions  . Tape Itching and Rash    ROS: Review of systems normal other than 12 systems except per HPI.  PMH:  Past Medical History:  Diagnosis Date  . ADHD (attention deficit hyperactivity disorder)   . Anxiety   . Arthritis   . Bulging lumbar disc   . Chronic back pain   . Depression   . Endometriosis   . Fibromyalgia   . Headache   . PTSD (post-traumatic stress disorder)     FH:  Family History  Problem Relation Age of Onset  . Hypertension Mother   . Hyperlipidemia Mother   . Hypertension Father   . Hyperlipidemia Father   . Diabetes Father     SH:  Social History   Social History  . Marital status: Single    Spouse name: N/A  . Number of children: N/A  . Years of education: N/A   Occupational History  . Not on file.   Social History Main Topics  . Smoking status: Never Smoker  . Smokeless tobacco: Never Used  . Alcohol use No  . Drug use: No  . Sexual activity: Not on file   Other Topics Concern  . Not on file   Social History Narrative  . No narrative on file    PSH:  Past Surgical History:  Procedure Laterality Date  . ABDOMINAL HYSTERECTOMY    . APPENDECTOMY    . BACK SURGERY    . EXPLORATORY LAPAROTOMY    . TONSILLECTOMY      Physical  Exam:   GEN-  Female with distress with biphasic stridor on 15L O2 full face mask OC/OP- normal with no masses or lesions NECK- supply with use of accessory muscles RESP- tachypnea with RR 30+  Trans-nasal flexible laryngoscopy-  After verbal consent obtained, the flexible laryngoscope was placed through the patient's right nostril without any anesthesia.  This demonstrated normal and widely patent nasopharynx, oropharynx, hypopharynx, and larynx.  The epiglottis is normal appearance with no inflammation or edema.  The vocal folds are mobile but slam shut intermittently with breathing through mouth.  Easy quick breaths through nose open larynx well with no stridorous breathing.  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  Widely patent airway and signifcant improvement with nasal breathing as well as after anti-anxiety medications.  ER to admit for observation and will see later today.  Recommend Omeprazole in case reflux is playing any role here.  Also recommend improved control of anxiety.  Discussed with patient referral to [facility] for possible evaluation of Botox/cordotomy given frequent episodes.  We will do this as an outpatient.     Summer Buckley 02/07/2016 6:35 PM

## 2016-02-07 NOTE — ED Notes (Signed)
Pt father Faelyn Speer [phone]

## 2016-02-07 NOTE — ED Notes (Signed)
Informed rn bed ready

## 2016-02-07 NOTE — ED Notes (Signed)
Pt began to cough and make choking noises during racemic epinephrine administration. Dr. Edd Fabian called to bedside.

## 2016-02-07 NOTE — ED Provider Notes (Signed)
[facility] Emergency Department Provider Note   ____________________________________________   First MD Initiated Contact with Patient 02/07/16 1211     (approximate)  I have reviewed the triage vital signs and the nursing notes.   HISTORY  Chief Complaint Respiratory Distress    HPI Summer Buckley is a 30 y.o. 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 She reports she has had cough over the past 2 days as well as some runny nose and sore throat. No vomiting, diarrhea, fevers or chills. No chest pain. She was sent from urgent care due to her symptoms as well as a concerning x-ray.   Past Medical History:  Diagnosis Date  . ADHD (attention deficit hyperactivity disorder)   . Anxiety   . Arthritis   . Bulging lumbar disc   . Chronic back pain   . Depression   . Endometriosis   . Fibromyalgia   . Headache   . PTSD (post-traumatic stress disorder)     There are no active problems to display for this patient.   Past Surgical History:  Procedure Laterality Date  . ABDOMINAL HYSTERECTOMY    . APPENDECTOMY    . BACK SURGERY    . TONSILLECTOMY      Prior to Admission medications   Medication Sig Start Date End Date Taking? Authorizing Provider  ALPRAZolam Duanne Moron) 1 MG tablet Take 1 mg by mouth 3 (three) times daily as needed for anxiety.    Historical Provider, MD  amphetamine-dextroamphetamine (ADDERALL) 20 MG tablet Take 20 mg by mouth 2 (two) times daily.    Historical Provider, MD  baclofen (LIORESAL) 10 MG tablet Take 1 tablet (10 mg total) by mouth 3 (three) times daily. 09/12/15 09/11/16  Victorino Dike, FNP  buPROPion (WELLBUTRIN SR) 200 MG 12 hr tablet Take 200 mg by mouth every morning.    Historical Provider, MD    cefUROXime (CEFTIN) 500 MG tablet Take 1 tablet (500 mg total) by mouth 2 (two) times daily. 05/15/15   Frederich Cha, MD  cephALEXin (KEFLEX) 500 MG capsule Take 1 capsule (500 mg total) by mouth 3 (three) times daily. 02/09/15   Nance Pear, MD  chlorpheniramine-HYDROcodone (TUSSIONEX PENNKINETIC ER) 10-8 MG/5ML SUER Take 5 mLs by mouth every 12 (twelve) hours as needed for cough. 05/15/15   Frederich Cha, MD  clonazePAM (KLONOPIN) 0.5 MG tablet Take 1 tablet (0.5 mg total) by mouth 2 (two) times daily as needed for anxiety. 05/15/15   Frederich Cha, MD  diazepam (VALIUM) 10 MG tablet Take 10 mg by mouth 3 (three) times daily.    Historical Provider, MD  diclofenac (VOLTAREN) 75 MG EC tablet Take 75 mg by mouth 2 (two) times daily.    Historical Provider, MD  gabapentin (NEURONTIN) 100 MG capsule Take 100 mg by mouth at bedtime.    Historical Provider, MD  HYDROcodone-acetaminophen (NORCO/VICODIN) 5-325 MG tablet Take 1 tablet by mouth every 4 (four) hours as needed for moderate pain. 09/12/15   Victorino Dike, FNP  metaxalone (SKELAXIN) 800 MG tablet Take 1 tablet (800 mg total) by mouth 3 (three) times daily. 09/18/15   Lorin Picket, PA-C  phenazopyridine (PYRIDIUM) 200 MG tablet Take 1 tablet (200 mg total) by mouth 3 (three) times daily as needed for pain. 02/09/15 02/09/16  Carter Kitten  Archie Balboa, MD  predniSONE (STERAPRED UNI-PAK 21 TAB) 10 MG (21) TBPK tablet Sig 6 tablet day 1, 5 tablets day 2, 4 tablets day 3,,3tablets day 4, 2 tablets day 5, 1 tablet day 6 take all tablets orally 05/15/15   Frederich Cha, MD  pregabalin (LYRICA) 75 MG capsule Take 75-150 mg by mouth 2 (two) times daily. 1 capsule every morning, 2 capsules at bedtime    Historical Provider, MD    Allergies Tape  No family history on file.  Social History Social History  Substance Use Topics  . Smoking status: Never Smoker  . Smokeless tobacco: Not on file  . Alcohol use No    Review of Systems Constitutional: No  fever/chills Eyes: No visual changes. ENT: + sore throat. Cardiovascular: Denies chest pain. Respiratory: + shortness of breath. Gastrointestinal: No abdominal pain.  No nausea, no vomiting.  No diarrhea.  No constipation. Genitourinary: Negative for dysuria. Musculoskeletal: Negative for back pain. Skin: Negative for rash. Neurological: Negative for headaches, focal weakness or numbness.  10-point ROS otherwise negative.  ____________________________________________   PHYSICAL EXAM:  Vitals:   02/07/16 1300 02/07/16 1319 02/07/16 1330 02/07/16 1400  BP: 120/87 120/87 119/82 123/78  Pulse: (!) 108 (!) 103 (!) 101 (!) 106  Resp: (!) 22 16 16 15   Temp:      TempSrc:      SpO2: 100% 100% 100% 100%  Weight:        VITAL SIGNS: ED Triage Vitals  Enc Vitals Group     BP      Pulse      Resp      Temp      Temp src      SpO2      Weight      Height      Head Circumference      Peak Flow      Pain Score      Pain Loc      Pain Edu?      Excl. in [place]?     Constitutional: Alert and oriented. Moderate respiratory distress secondary to stridor, mild tachypnea, increased work of breathing. Eyes: Conjunctivae are normal. PERRL. EOMI. Head: Atraumatic. Nose: No congestion/rhinnorhea. Mouth/Throat: Mucous membranes are moist.  Oropharynx non-erythematous. Neck: + inspiratory stridor at rest. Cardiovascular: Tachycardic rate, regular rhythm. Grossly normal heart sounds.  Good peripheral circulation. Respiratory: Increased work of breathing.+intercostal retractions. Lungs CTAB. Gastrointestinal: Soft and nontender. No distention.  No CVA tenderness. Genitourinary: deferred Musculoskeletal: No lower extremity tenderness nor edema.  No joint effusions. Neurologic:  Normal speech and language. No gross focal neurologic deficits are appreciated.  Skin:  Skin is warm, dry and intact. No rash noted. Psychiatric: Mood and affect are normal. Speech and behavior are  normal.  ____________________________________________   LABS (all labs ordered are listed, but only abnormal results are displayed)  Labs Reviewed  COMPREHENSIVE METABOLIC PANEL - Abnormal; Notable for the following:       Result Value   Potassium 3.2 (*)    Glucose, Bld 118 (*)    ALT 13 (*)    All other components within normal limits  CBC WITH DIFFERENTIAL/PLATELET   ____________________________________________  EKG  none ____________________________________________  RADIOLOGY  CXR IMPRESSION:  1. Cardiomegaly. No evidence of congestive heart failure.    2. Evidence of prior granulomas disease. No acute pulmonary disease.       Xray soft-tissue neck IMPRESSION:  Negative exam.     ____________________________________________   PROCEDURES  Procedure(s) performed: None  Procedures  Critical Care performed: No  ____________________________________________   INITIAL IMPRESSION / ASSESSMENT AND PLAN / ED COURSE  Pertinent labs & imaging results that were available during my care of the patient were reviewed by me and considered in my medical decision making (see chart for details).  Summer Buckley is a 30 y.o. 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 On arrival to the emergency department, she is in moderate to severe respiratory distress with respiratory stridor, retractions, clear lung fields, normal O2 saturation. We'll obtain plain films of the neck and chest, treat symptomatically with Solu-Medrol, she received a DuoNeb, will give racemic epi.  ----------------------------------------- 12:49 PM on 02/07/2016 ----------------------------------------- Patient began to have a severe coughing fit and was notably short of breath. I discussed the case with Dr. Pryor Ochoa of ENT who has  evaluated the patient emergency department and performed a nasal pharyngeal scope, epiglottis and airway are within normal limits. She did have paradoxical closure of the vocal cords. Patient is receiving Ativan which she reported has been helpful in the past. She already appears to have improved work of breathing at this time.  ----------------------------------------- 2:18 PM on 02/07/2016 ----------------------------------------- Patient with resolution of stridor at this time, no increased work of breathing, resting comfortably. I discussed the case again with Dr. Pryor Ochoa who agrees with hospitalist admission. I discussed case with the hospitalist for admission at this time.  Clinical Course     ____________________________________________   FINAL CLINICAL IMPRESSION(S) / ED DIAGNOSES  Final diagnoses:  Stridor  Respiratory distress      NEW MEDICATIONS STARTED DURING THIS VISIT:  New Prescriptions   No medications on file     Note:  This document was prepared using Dragon voice recognition software and may include unintentional dictation errors.    Joanne Gavel, MD 02/07/16 [phone]

## 2016-02-08 LAB — CBC
HCT: 36.6 % (ref 35.0–47.0)
HEMOGLOBIN: 12.9 g/dL (ref 12.0–16.0)
MCH: 30.4 pg (ref 26.0–34.0)
MCHC: 35.2 g/dL (ref 32.0–36.0)
MCV: 86.4 fL (ref 80.0–100.0)
Platelets: 247 10*3/uL (ref 150–440)
RBC: 4.24 MIL/uL (ref 3.80–5.20)
RDW: 13.2 % (ref 11.5–14.5)
WBC: 11.2 10*3/uL — ABNORMAL HIGH (ref 3.6–11.0)

## 2016-02-08 LAB — BASIC METABOLIC PANEL
ANION GAP: 8 (ref 5–15)
BUN: 11 mg/dL (ref 6–20)
CHLORIDE: 104 mmol/L (ref 101–111)
CO2: 26 mmol/L (ref 22–32)
Calcium: 9.3 mg/dL (ref 8.9–10.3)
Creatinine, Ser: 0.53 mg/dL (ref 0.44–1.00)
GFR calc non Af Amer: >60 mL/min (ref >60)
Glucose, Bld: 150 mg/dL — ABNORMAL HIGH (ref 65–99)
Potassium: 4 mmol/L (ref 3.5–5.1)
Sodium: 138 mmol/L (ref 135–145)

## 2016-02-08 MED ORDER — PREDNISONE 10 MG (21) PO TBPK: ORAL_TABLET | ORAL | 0 refills | Status: DC

## 2016-02-08 MED ORDER — PREDNISONE 10 MG (21) PO TBPK: 10.0000 mg | ORAL_TABLET | Freq: Every day | ORAL | 0 refills | Status: DC

## 2016-02-08 MED ORDER — ALBUTEROL SULFATE HFA 108 (90 BASE) MCG/ACT IN AERS: 2.0000 | INHALATION_SPRAY | Freq: Four times a day (QID) | RESPIRATORY_TRACT | 2 refills | Status: DC | PRN

## 2016-02-08 NOTE — Discharge Summary (Signed)
Summer Buckley, is a 30 y.o. female  DOB Jun 22, 1985  MRN [id number].  Admission date:  02/07/2016  Admitting Physician  [name], MD  Discharge Date:  02/08/2016   Primary MD  [place]  Recommendations for primary care physician for things to follow:  Follow-up with the ENT Dr.Juengel in one week   Admission Diagnosis  Stridor [R06.1] Respiratory distress [R06.00]   Discharge Diagnosis  Stridor [R06.1] Respiratory distress [R06.00]    Active Problems:   Shortness of breath      Past Medical History:  Diagnosis Date  . ADHD (attention deficit hyperactivity disorder)   . Anxiety   . Arthritis   . Bulging lumbar disc   . Chronic back pain   . Depression   . Endometriosis   . Fibromyalgia   . Headache   . PTSD (post-traumatic stress disorder)     Past Surgical History:  Procedure Laterality Date  . ABDOMINAL HYSTERECTOMY    . APPENDECTOMY    . BACK SURGERY    . EXPLORATORY LAPAROTOMY    . TONSILLECTOMY         History of present illness and  Hospital Course:     Kindly see H&P for history of present illness and admission details, please review complete Labs, Consult reports and Test reports for all details in brief  HPI  from the history and physical done on the day of admission  30 year old female patient admitted because of respiratory distress and paroxysmal vocal cord closure. Patient seen by ENT in the emergency room, had a laryngoscopy evaluation and patient airway, epiglottis were within normal limits. But ENT noticed that she did have paroxysmal) vocal cords. They recommended overnight observation.  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 Discharge home with prednisone dose taper, albuterol inhaler. No wheezing today. Advised her to follow up with ENT physician as an outpatient. Patient has seen Dr. Kathyrn Sheriff before, 2.anxierty/agitation: Patient is on Xanax. #3 history of ADHD: She is on Adderall. #4. Depression: Patient is on Wellbutrin xl  150 mg daily   Discharge Condition: Stable   Follow UP  Follow-up Information    [facility] Follow up in 1 week(s).   Specialty:  Family Medicine Contact information: [address] [place] [address] [phone]        Huey Romans, MD Follow up in 1 week(s).   Specialty:  Otolaryngology Contact information: [address] [place] [address] [phone]             Discharge Instructions  and  Discharge Medications   Stable     Medication List    STOP taking these medications   cefUROXime 500 MG tablet Commonly known as:  CEFTIN   chlorpheniramine-HYDROcodone 10-8 MG/5ML Suer Commonly known as:  TUSSIONEX PENNKINETIC ER     TAKE these medications   albuterol 108 (90 Base) MCG/ACT inhaler Commonly known as:  PROVENTIL HFA;VENTOLIN HFA Inhale 2 puffs into the lungs every 6 (six) hours as needed for wheezing or shortness of breath.   ALPRAZolam 1 MG tablet Commonly known as:  XANAX Take 1 mg by mouth 4 (four) times daily.   amphetamine-dextroamphetamine 30 MG tablet Commonly known as:  ADDERALL Take 30 mg by mouth daily.   baclofen 10 MG tablet Commonly known as:  LIORESAL Take 1  tablet (10 mg total) by mouth 3 (three) times daily. What changed:  when to take this   buPROPion 200 MG 12 hr tablet Commonly known as:  WELLBUTRIN SR Take 200 mg by mouth every morning.   cephALEXin 500 MG capsule Commonly known as:  KEFLEX Take 1 capsule (500 mg total) by mouth 3 (three) times daily.   clonazePAM 0.5 MG tablet Commonly known as:  KLONOPIN Take 1 tablet (0.5 mg total) by mouth 2  (two) times daily as needed for anxiety.   cyclobenzaprine 5 MG tablet Commonly known as:  FLEXERIL Take 5 mg by mouth 3 (three) times daily as needed for muscle spasms.   diazepam 10 MG tablet Commonly known as:  VALIUM Take 10 mg by mouth 3 (three) times daily.   diclofenac 75 MG EC tablet Commonly known as:  VOLTAREN Take 75 mg by mouth 2 (two) times daily.   estrogens (conjugated) 1.25 MG tablet Commonly known as:  PREMARIN Take 1.25 mg by mouth daily.   gabapentin 100 MG capsule Commonly known as:  NEURONTIN Take 100 mg by mouth at bedtime.   HYDROcodone-acetaminophen 5-325 MG tablet Commonly known as:  NORCO/VICODIN Take 1 tablet by mouth every 4 (four) hours as needed for moderate pain.   metaxalone 800 MG tablet Commonly known as:  SKELAXIN Take 1 tablet (800 mg total) by mouth 3 (three) times daily.   phenazopyridine 200 MG tablet Commonly known as:  PYRIDIUM Take 1 tablet (200 mg total) by mouth 3 (three) times daily as needed for pain.   predniSONE 10 MG (21) Tbpk tablet Commonly known as:  STERAPRED UNI-PAK 21 TAB Take 1 tablet (10 mg total) by mouth daily. Take as directed as dose taper What changed:  how much to take  how to take this  when to take this  additional instructions   pregabalin 75 MG capsule Commonly known as:  LYRICA Take 75-150 mg by mouth 2 (two) times daily. 1 capsule every morning, 2 capsules at bedtime   traMADol 50 MG tablet Commonly known as:  ULTRAM Take 50 mg by mouth every 4 (four) hours.         Diet and Activity recommendation: See Discharge Instructions above   Consults obtained -ENT   Major procedures and Radiology Reports - PLEASE review detailed and final reports for all details, in brief -      Dg Neck Soft Tissue  Result Date: 02/07/2016 CLINICAL DATA:  Stridor today.  The patient is unable to speak. EXAM: NECK SOFT TISSUES - 1+ VIEW COMPARISON:  CT cervical spine 09/12/2015. FINDINGS: There is no  evidence of retropharyngeal soft tissue swelling or epiglottic enlargement. The cervical airway is unremarkable and no radio-opaque foreign body identified. IMPRESSION: Negative exam. Electronically Signed   By: Inge Rise M.D.   On: 02/07/2016 12:35   Dg Chest Portable 1 View  Result Date: 02/07/2016 CLINICAL DATA:  Stridor.  12/25/2014 . EXAM: PORTABLE CHEST 1 VIEW COMPARISON:  12/25/2014. FINDINGS: Mediastinum and hilar structures normal. Cardiomegaly with normal pulmonary vascularity. No focal infiltrate. No pleural effusion or pneumothorax. Stable tiny calcified pulmonary nodules noted consistent granulomas. No acute bony abnormality . IMPRESSION: 1.  Cardiomegaly.  No evidence of congestive heart failure. 2. Evidence of prior granulomas disease. No acute pulmonary disease. Electronically Signed   By: Marcello Moores  Register   On: 02/07/2016 12:37    Micro Results    No results found for this or any previous visit (from the past 240 hour(s)).  Today   Subjective:   Sony Vancourt today has no headache,no chest abdominal pain,no new weakness tingling or numbness, feels much better wants to go home today.   Objective:   Blood pressure (!) 119/55, pulse (!) 115, temperature 98 F (36.7 C), temperature source Oral, resp. rate 18, height 5\' 5"  (1.651 m), weight 60.3 kg (133 lb), SpO2 100 %.   Intake/Output Summary (Last 24 hours) at 02/08/16 1252 Last data filed at 02/08/16 0942  Gross per 24 hour  Intake              600 ml  Output             1250 ml  Net             -650 ml    Exam Awake Alert, Oriented x 3, No new F.N deficits, Normal affect [place].AT,PERRAL Supple Neck,No JVD, No cervical lymphadenopathy appriciated.  Symmetrical Chest wall movement, Good air movement bilaterally, CTAB RRR,No Gallops,Rubs or new Murmurs, No Parasternal Heave +ve B.Sounds, Abd Soft, Non tender, No organomegaly appriciated, No rebound -guarding or rigidity. No Cyanosis, Clubbing or edema,  No new Rash or bruise  Data Review   CBC w Diff: Lab Results  Component Value Date   WBC 11.2 (H) 02/08/2016   HGB 12.9 02/08/2016   HGB 12.8 03/28/2013   HCT 36.6 02/08/2016   HCT 36.7 03/28/2013   PLT 247 02/08/2016   PLT 212 03/28/2013   LYMPHOPCT 31 02/07/2016   MONOPCT 7 02/07/2016   EOSPCT 9 02/07/2016   BASOPCT 1 02/07/2016    CMP: Lab Results  Component Value Date   NA 138 02/08/2016   NA 138 03/28/2013   K 4.0 02/08/2016   K 4.3 03/29/2013   CL 104 02/08/2016   CL 105 03/28/2013   CO2 26 02/08/2016   CO2 29 03/28/2013   BUN 11 02/08/2016   BUN 11 03/28/2013   CREATININE 0.53 02/08/2016   CREATININE 0.77 03/28/2013   PROT 7.8 02/07/2016   PROT 7.5 03/28/2013   ALBUMIN 4.5 02/07/2016   ALBUMIN 4.0 03/28/2013   BILITOT 0.3 02/07/2016   BILITOT 0.4 03/28/2013   ALKPHOS 89 02/07/2016   ALKPHOS 68 03/28/2013   AST 24 02/07/2016   AST 26 03/28/2013   ALT 13 (L) 02/07/2016   ALT 18 03/28/2013  .   Total Time in preparing paper work, data evaluation and todays exam - 56 minutes  Eulogio Requena M.D on 02/08/2016 at 12:52 PM    Note: This dictation was prepared with Dragon dictation along with smaller phrase technology. Any transcriptional errors that result from this process are unintentional.

## 2016-02-08 NOTE — Progress Notes (Signed)
Medication Related Note- Bupropion  [facility] with [facility] Bupropion XL 300mg  daily since May 2016. Spoke with patient about this and patient said that she could not remember what strength she took. She said that the doctor has been giving her samples to figure out the best strength that will work with her depression.   Loree Fee, PharmD Clinical Pharmacist 1:57 PM 02/08/2016

## 2016-02-25 ENCOUNTER — Ambulatory Visit: Payer: Medicaid Other | Attending: Anesthesiology | Admitting: Physical Therapy

## 2016-04-08 ENCOUNTER — Other Ambulatory Visit: Payer: Self-pay | Admitting: Orthopedic Surgery

## 2016-04-08 DIAGNOSIS — S43421D Sprain of right rotator cuff capsule, subsequent encounter: Secondary | ICD-10-CM

## 2016-04-18 ENCOUNTER — Ambulatory Visit
Admission: RE | Admit: 2016-04-18 | Discharge: 2016-04-18 | Disposition: A | Discharge status: Home / Self Care | Payer: Medicaid Other | Source: Ambulatory Visit | Attending: Orthopedic Surgery | Admitting: Orthopedic Surgery

## 2016-04-18 DIAGNOSIS — M25511 Pain in right shoulder: Secondary | ICD-10-CM | POA: Diagnosis not present

## 2016-04-18 DIAGNOSIS — X58XXXD Exposure to other specified factors, subsequent encounter: Secondary | ICD-10-CM | POA: Diagnosis not present

## 2016-04-18 DIAGNOSIS — G8929 Other chronic pain: Secondary | ICD-10-CM | POA: Diagnosis not present

## 2016-04-18 DIAGNOSIS — S43421D Sprain of right rotator cuff capsule, subsequent encounter: Secondary | ICD-10-CM | POA: Diagnosis present

## 2016-05-14 DIAGNOSIS — S46911A Strain of unspecified muscle, fascia and tendon at shoulder and upper arm level, right arm, initial encounter: Secondary | ICD-10-CM | POA: Insufficient documentation

## 2016-05-14 DIAGNOSIS — M7581 Other shoulder lesions, right shoulder: Secondary | ICD-10-CM | POA: Insufficient documentation

## 2016-05-21 NOTE — Patient Instructions (Signed)
  Your procedure is scheduled on: 05-29-16 [facility]) Report to Same Day Surgery 2nd floor medical mall [facility] Entrance-take elevator on left to 2nd floor.  Check in with surgery information desk.) To find out your arrival time please call [phone] between 1PM - 3PM on 05-28-16 [facility])  Remember: Instructions that are not followed completely may result in serious medical risk, up to and including death, or upon the discretion of your surgeon and anesthesiologist your surgery may need to be rescheduled.    _x___ 1. Do not eat food or drink liquids after midnight. No gum chewing or hard candies.     __x__ 2. No Alcohol for 24 hours before or after surgery.   __x__3. No Smoking for 24 prior to surgery.   ____  4. Bring all medications with you on the day of surgery if instructed.    __x__ 5. Notify your doctor if there is any change in your medical condition     (cold, fever, infections).     Do not wear jewelry, make-up, hairpins, clips or nail polish.  Do not wear lotions, powders, or perfumes. You may wear deodorant.  Do not shave 48 hours prior to surgery. Men may shave face and neck.  Do not bring valuables to the hospital.    [facility] is not responsible for any belongings or valuables.               Contacts, dentures or bridgework may not be worn into surgery.  Leave your suitcase in the car. After surgery it may be brought to your room.  For patients admitted to the hospital, discharge time is determined by your treatment team.   Patients discharged the day of surgery will not be allowed to drive home.  You will need someone to drive you home and stay with you the night of your procedure.    Please read over the following fact sheets that you were given:   [facility] Preparing for Surgery and or MRSA Information   _x___ Take these medicines the morning of surgery with A SIP OF WATER:    1. XANAX  2.  3.  4.  5.  6.  ____Fleets enema or Magnesium  Citrate as directed.   _x___ Use CHG Soap or sage wipes as directed on instruction sheet   _X___ Use inhalers on the day of surgery and bring to hospital day of surgery-USE ALBUTEROL INHALER AT [place]  ____ Stop metformin 2 days prior to surgery    ____ Take 1/2 of usual insulin dose the night before surgery and none on the morning of surgery.   ____ Stop Aspirin, Coumadin, Pllavix ,Eliquis, Effient, or Pradaxa  x__ Stop Anti-inflammatories such as Advil, Aleve, Ibuprofen, Motrin, Naproxen,          Naprosyn, Goodies powders or aspirin products-STOP IBUPROFEN NOW-Ok to take Tylenol OR TRAMADOL   ____ Stop supplements until after surgery.    ____ Bring C-Pap to the hospital.

## 2016-05-22 ENCOUNTER — Encounter
Admission: RE | Admit: 2016-05-22 | Discharge: 2016-05-22 | Disposition: A | Discharge status: Home / Self Care | Payer: Medicaid Other | Source: Ambulatory Visit | Attending: Surgery | Admitting: Surgery

## 2016-05-22 DIAGNOSIS — M75111 Incomplete rotator cuff tear or rupture of right shoulder, not specified as traumatic: Secondary | ICD-10-CM | POA: Insufficient documentation

## 2016-05-22 DIAGNOSIS — I498 Other specified cardiac arrhythmias: Secondary | ICD-10-CM | POA: Insufficient documentation

## 2016-05-22 DIAGNOSIS — I4510 Unspecified right bundle-branch block: Secondary | ICD-10-CM | POA: Insufficient documentation

## 2016-05-22 DIAGNOSIS — Z01812 Encounter for preprocedural laboratory examination: Secondary | ICD-10-CM | POA: Diagnosis not present

## 2016-05-22 DIAGNOSIS — Z01818 Encounter for other preprocedural examination: Secondary | ICD-10-CM | POA: Insufficient documentation

## 2016-05-22 HISTORY — DX: Spontaneous ecchymoses: R23.3

## 2016-05-22 HISTORY — DX: Gastro-esophageal reflux disease without esophagitis: K21.9

## 2016-05-22 HISTORY — DX: Other skin changes: R23.8

## 2016-05-22 HISTORY — DX: Personal history of Methicillin resistant Staphylococcus aureus infection: Z86.14

## 2016-05-22 HISTORY — DX: Unspecified injury of neck, initial encounter: S19.9XXA

## 2016-05-22 LAB — SURGICAL PCR SCREEN
MRSA, PCR: NEGATIVE
Staphylococcus aureus: NEGATIVE

## 2016-05-28 MED ORDER — CEFAZOLIN SODIUM-DEXTROSE 2-4 GM/100ML-% IV SOLN
2.0000 g | Freq: Once | INTRAVENOUS | Status: AC
  Administered 2016-05-29: 2 g via INTRAVENOUS

## 2016-05-29 ENCOUNTER — Ambulatory Visit: Payer: Medicaid Other | Admitting: Anesthesiology

## 2016-05-29 ENCOUNTER — Encounter
Admission: RE | Disposition: A | Discharge status: Home / Self Care | Payer: Self-pay | Source: Ambulatory Visit | Attending: Surgery

## 2016-05-29 ENCOUNTER — Encounter: Payer: Self-pay | Admitting: *Deleted

## 2016-05-29 ENCOUNTER — Ambulatory Visit
Admission: RE | Admit: 2016-05-29 | Discharge: 2016-05-29 | Disposition: A | Discharge status: Home / Self Care | Payer: Medicaid Other | Source: Ambulatory Visit | Attending: Surgery | Admitting: Surgery

## 2016-05-29 DIAGNOSIS — M7541 Impingement syndrome of right shoulder: Secondary | ICD-10-CM | POA: Diagnosis not present

## 2016-05-29 DIAGNOSIS — M7521 Bicipital tendinitis, right shoulder: Secondary | ICD-10-CM | POA: Insufficient documentation

## 2016-05-29 DIAGNOSIS — K219 Gastro-esophageal reflux disease without esophagitis: Secondary | ICD-10-CM | POA: Diagnosis not present

## 2016-05-29 DIAGNOSIS — M25811 Other specified joint disorders, right shoulder: Secondary | ICD-10-CM | POA: Diagnosis present

## 2016-05-29 DIAGNOSIS — M549 Dorsalgia, unspecified: Secondary | ICD-10-CM | POA: Diagnosis not present

## 2016-05-29 DIAGNOSIS — G8929 Other chronic pain: Secondary | ICD-10-CM | POA: Diagnosis not present

## 2016-05-29 DIAGNOSIS — F909 Attention-deficit hyperactivity disorder, unspecified type: Secondary | ICD-10-CM | POA: Diagnosis not present

## 2016-05-29 DIAGNOSIS — M1990 Unspecified osteoarthritis, unspecified site: Secondary | ICD-10-CM | POA: Insufficient documentation

## 2016-05-29 DIAGNOSIS — F419 Anxiety disorder, unspecified: Secondary | ICD-10-CM | POA: Insufficient documentation

## 2016-05-29 DIAGNOSIS — M797 Fibromyalgia: Secondary | ICD-10-CM | POA: Diagnosis not present

## 2016-05-29 DIAGNOSIS — F4310 Post-traumatic stress disorder, unspecified: Secondary | ICD-10-CM | POA: Diagnosis not present

## 2016-05-29 DIAGNOSIS — F329 Major depressive disorder, single episode, unspecified: Secondary | ICD-10-CM | POA: Insufficient documentation

## 2016-05-29 HISTORY — PX: SHOULDER ARTHROSCOPY WITH BICEPS TENDON REPAIR: SHX5674

## 2016-05-29 HISTORY — PX: SHOULDER ARTHROSCOPY WITH SUBACROMIAL DECOMPRESSION: SHX5684

## 2016-05-29 SURGERY — SHOULDER ARTHROSCOPY WITH BICEPS TENDON REPAIR
Anesthesia: General | Site: Shoulder | Laterality: Right | Wound class: Clean

## 2016-05-29 MED ORDER — BUPIVACAINE-EPINEPHRINE (PF) 0.25% -1:200000 IJ SOLN
INTRAMUSCULAR | Status: AC
  Filled 2016-05-29: qty 30

## 2016-05-29 MED ORDER — ONDANSETRON HCL 4 MG/2ML IJ SOLN
INTRAMUSCULAR | Status: DC | PRN
  Administered 2016-05-29: 4 mg via INTRAVENOUS

## 2016-05-29 MED ORDER — ROCURONIUM BROMIDE 100 MG/10ML IV SOLN
INTRAVENOUS | Status: DC | PRN
  Administered 2016-05-29: 50 mg via INTRAVENOUS

## 2016-05-29 MED ORDER — FAMOTIDINE 20 MG PO TABS
ORAL_TABLET | ORAL | Status: AC
Start: 2016-05-29 — End: 2016-05-29
  Administered 2016-05-29: 20 mg via ORAL
  Filled 2016-05-29: qty 1

## 2016-05-29 MED ORDER — ROPIVACAINE HCL 5 MG/ML IJ SOLN
INTRAMUSCULAR | Status: DC | PRN
  Administered 2016-05-29: 30 mL via PERINEURAL

## 2016-05-29 MED ORDER — MIDAZOLAM HCL 2 MG/2ML IJ SOLN
INTRAMUSCULAR | Status: AC
  Administered 2016-05-29: 2 mg via INTRAVENOUS
  Filled 2016-05-29: qty 2

## 2016-05-29 MED ORDER — ROPIVACAINE HCL 5 MG/ML IJ SOLN
INTRAMUSCULAR | Status: AC
  Filled 2016-05-29: qty 40

## 2016-05-29 MED ORDER — SUGAMMADEX SODIUM 200 MG/2ML IV SOLN
INTRAVENOUS | Status: AC
  Filled 2016-05-29: qty 2

## 2016-05-29 MED ORDER — SUCCINYLCHOLINE CHLORIDE 200 MG/10ML IV SOSY
PREFILLED_SYRINGE | INTRAVENOUS | Status: AC
  Filled 2016-05-29: qty 10

## 2016-05-29 MED ORDER — OXYCODONE HCL 5 MG/5ML PO SOLN: 5.0000 mg | Freq: Once | ORAL | Status: DC | PRN

## 2016-05-29 MED ORDER — LIDOCAINE 2% (20 MG/ML) 5 ML SYRINGE
INTRAMUSCULAR | Status: AC
  Filled 2016-05-29: qty 5

## 2016-05-29 MED ORDER — PHENYLEPHRINE HCL 10 MG/ML IJ SOLN
INTRAMUSCULAR | Status: DC | PRN
  Administered 2016-05-29 (×5): 80 ug via INTRAVENOUS

## 2016-05-29 MED ORDER — ONDANSETRON HCL 4 MG/2ML IJ SOLN
INTRAMUSCULAR | Status: AC
  Filled 2016-05-29: qty 2

## 2016-05-29 MED ORDER — BUPIVACAINE-EPINEPHRINE 0.25% -1:200000 IJ SOLN
INTRAMUSCULAR | Status: DC | PRN
  Administered 2016-05-29: 30 mL

## 2016-05-29 MED ORDER — OXYCODONE HCL 5 MG PO TABS: 5.0000 mg | ORAL_TABLET | Freq: Once | ORAL | Status: DC | PRN

## 2016-05-29 MED ORDER — OXYCODONE HCL 5 MG PO TABS
ORAL_TABLET | ORAL | Status: AC
  Filled 2016-05-29: qty 1

## 2016-05-29 MED ORDER — FENTANYL CITRATE (PF) 100 MCG/2ML IJ SOLN
INTRAMUSCULAR | Status: AC
  Filled 2016-05-29: qty 2

## 2016-05-29 MED ORDER — CYCLOBENZAPRINE HCL 5 MG PO TABS: 5.0000 mg | ORAL_TABLET | Freq: Three times a day (TID) | ORAL | 0 refills | Status: DC | PRN

## 2016-05-29 MED ORDER — LIDOCAINE HCL (PF) 1 % IJ SOLN
INTRAMUSCULAR | Status: AC
  Filled 2016-05-29: qty 5

## 2016-05-29 MED ORDER — PROPOFOL 10 MG/ML IV BOLUS
INTRAVENOUS | Status: AC
  Filled 2016-05-29: qty 20

## 2016-05-29 MED ORDER — BUPIVACAINE HCL (PF) 0.5 % IJ SOLN
INTRAMUSCULAR | Status: AC
  Filled 2016-05-29: qty 30

## 2016-05-29 MED ORDER — LACTATED RINGERS IV SOLN
INTRAVENOUS | Status: DC
  Administered 2016-05-29: 10:00:00 via INTRAVENOUS

## 2016-05-29 MED ORDER — SUGAMMADEX SODIUM 200 MG/2ML IV SOLN
INTRAVENOUS | Status: DC | PRN
  Administered 2016-05-29: 130 mg via INTRAVENOUS

## 2016-05-29 MED ORDER — DEXAMETHASONE SODIUM PHOSPHATE 10 MG/ML IJ SOLN
INTRAMUSCULAR | Status: DC | PRN
  Administered 2016-05-29: 10 mg via INTRAVENOUS

## 2016-05-29 MED ORDER — EPINEPHRINE PF 1 MG/ML IJ SOLN
INTRAMUSCULAR | Status: AC
  Filled 2016-05-29: qty 3

## 2016-05-29 MED ORDER — FAMOTIDINE 20 MG PO TABS
20.0000 mg | ORAL_TABLET | Freq: Once | ORAL | Status: AC
  Administered 2016-05-29: 20 mg via ORAL

## 2016-05-29 MED ORDER — EPHEDRINE 5 MG/ML INJ
INTRAVENOUS | Status: AC
  Filled 2016-05-29: qty 10

## 2016-05-29 MED ORDER — CEFAZOLIN SODIUM-DEXTROSE 2-4 GM/100ML-% IV SOLN
INTRAVENOUS | Status: AC
  Filled 2016-05-29: qty 100

## 2016-05-29 MED ORDER — OXYCODONE HCL 5 MG PO TABS: 5.0000 mg | ORAL_TABLET | ORAL | 0 refills | Status: DC | PRN

## 2016-05-29 MED ORDER — LIDOCAINE HCL (CARDIAC) 20 MG/ML IV SOLN
INTRAVENOUS | Status: DC | PRN
  Administered 2016-05-29: 60 mg via INTRAVENOUS

## 2016-05-29 MED ORDER — MEPERIDINE HCL 25 MG/ML IJ SOLN: 6.2500 mg | INTRAMUSCULAR | Status: DC | PRN

## 2016-05-29 MED ORDER — DEXAMETHASONE SODIUM PHOSPHATE 10 MG/ML IJ SOLN
INTRAMUSCULAR | Status: AC
  Filled 2016-05-29: qty 1

## 2016-05-29 MED ORDER — PROMETHAZINE HCL 25 MG/ML IJ SOLN: 6.2500 mg | INTRAMUSCULAR | Status: DC | PRN

## 2016-05-29 MED ORDER — EPINEPHRINE PF 1 MG/ML IJ SOLN
INTRAMUSCULAR | Status: DC | PRN
  Administered 2016-05-29: 2 mL

## 2016-05-29 MED ORDER — MIDAZOLAM HCL 2 MG/2ML IJ SOLN
INTRAMUSCULAR | Status: DC | PRN
  Administered 2016-05-29: 2 mg via INTRAVENOUS

## 2016-05-29 MED ORDER — MIDAZOLAM HCL 2 MG/2ML IJ SOLN
2.0000 mg | Freq: Once | INTRAMUSCULAR | Status: AC
Start: 2016-05-29 — End: 2016-05-29
  Administered 2016-05-29: 2 mg via INTRAVENOUS

## 2016-05-29 MED ORDER — FENTANYL CITRATE (PF) 100 MCG/2ML IJ SOLN: 25.0000 ug | INTRAMUSCULAR | Status: DC | PRN

## 2016-05-29 MED ORDER — PROPOFOL 10 MG/ML IV BOLUS
INTRAVENOUS | Status: DC | PRN
  Administered 2016-05-29: 150 mg via INTRAVENOUS

## 2016-05-29 MED ORDER — MIDAZOLAM HCL 2 MG/2ML IJ SOLN
INTRAMUSCULAR | Status: AC
  Filled 2016-05-29: qty 2

## 2016-05-29 MED ORDER — FENTANYL CITRATE (PF) 100 MCG/2ML IJ SOLN
INTRAMUSCULAR | Status: DC | PRN
  Administered 2016-05-29 (×2): 50 ug via INTRAVENOUS

## 2016-05-29 SURGICAL SUPPLY — 47 items
ANCH SUT 2 2.9 2 LD TPR NDL (Anchor) ×2 IMPLANT
ANCHOR JUGGERKNOT WTAP NDL 2.9 (Anchor) ×7 IMPLANT
BIT DRILL JUGRKNT W/NDL BIT2.9 (DRILL) ×3 IMPLANT
BLADE FULL RADIUS 3.5 (BLADE) ×4 IMPLANT
BLADE SHAVER 4.5X7 STR FR (MISCELLANEOUS) ×1 IMPLANT
BUR ACROMIONIZER 4.0 (BURR) ×4 IMPLANT
BUR BR 5.5 WIDE MOUTH (BURR) ×1 IMPLANT
CANNULA SHAVER 8MMX76MM (CANNULA) ×4 IMPLANT
CHLORAPREP W/TINT 26ML (MISCELLANEOUS) ×8 IMPLANT
COVER MAYO STAND STRL (DRAPES) ×4 IMPLANT
DRAPE IMP U-DRAPE 54X76 (DRAPES) ×8 IMPLANT
DRILL JUGGERKNOT W/NDL BIT 2.9 (DRILL) ×4
DRSG OPSITE POSTOP 4X8 (GAUZE/BANDAGES/DRESSINGS) ×4 IMPLANT
ELECT REM PT RETURN 9FT ADLT (ELECTROSURGICAL) ×4
ELECTRODE REM PT RTRN 9FT ADLT (ELECTROSURGICAL) ×2 IMPLANT
GAUZE PETRO XEROFOAM 1X8 (MISCELLANEOUS) ×4 IMPLANT
GAUZE SPONGE 4X4 12PLY STRL (GAUZE/BANDAGES/DRESSINGS) ×4 IMPLANT
GLOVE BIO SURGEON STRL SZ7.5 (GLOVE) ×8 IMPLANT
GLOVE BIO SURGEON STRL SZ8 (GLOVE) ×8 IMPLANT
GLOVE BIOGEL PI IND STRL 8 (GLOVE) ×2 IMPLANT
GLOVE BIOGEL PI INDICATOR 8 (GLOVE) ×2
GLOVE INDICATOR 8.0 STRL GRN (GLOVE) ×4 IMPLANT
GOWN STRL REUS W/ TWL LRG LVL3 (GOWN DISPOSABLE) ×4 IMPLANT
GOWN STRL REUS W/ TWL XL LVL3 (GOWN DISPOSABLE) ×2 IMPLANT
GOWN STRL REUS W/TWL LRG LVL3 (GOWN DISPOSABLE) ×8
GOWN STRL REUS W/TWL XL LVL3 (GOWN DISPOSABLE) ×4
GRASPER SUT 15 45D LOW PRO (SUTURE) ×2 IMPLANT
IV LACTATED RINGER IRRG 3000ML (IV SOLUTION) ×8
IV LR IRRIG 3000ML ARTHROMATIC (IV SOLUTION) ×4 IMPLANT
MANIFOLD NEPTUNE II (INSTRUMENTS) ×4 IMPLANT
MASK FACE SPIDER DISP (MASK) ×4 IMPLANT
MAT BLUE FLOOR 46X72 FLO (MISCELLANEOUS) ×4 IMPLANT
NDL REVERSE CUT 1/2 CRC (NEEDLE) ×1 IMPLANT
NEEDLE REVERSE CUT 1/2 CRC (NEEDLE) IMPLANT
PACK ARTHROSCOPY SHOULDER (MISCELLANEOUS) ×4 IMPLANT
SLING ARM LRG DEEP (SOFTGOODS) ×1 IMPLANT
SLING ULTRA II LG (MISCELLANEOUS) ×4 IMPLANT
STAPLER SKIN PROX 35W (STAPLE) ×4 IMPLANT
STRAP SAFETY BODY (MISCELLANEOUS) ×4 IMPLANT
SUT ETHIBOND 0 MO6 C/R (SUTURE) ×4 IMPLANT
SUT VIC AB 2-0 CT1 27 (SUTURE) ×8
SUT VIC AB 2-0 CT1 TAPERPNT 27 (SUTURE) ×4 IMPLANT
TAPE MICROFOAM 4IN (TAPE) ×4 IMPLANT
TUBING ARTHRO INFLOW-ONLY STRL (TUBING) ×4 IMPLANT
TUBING CONNECTING 10 (TUBING) ×3 IMPLANT
TUBING CONNECTING 10' (TUBING) ×1
WAND HAND CNTRL MULTIVAC 90 (MISCELLANEOUS) ×4 IMPLANT

## 2016-05-29 NOTE — H&P (Signed)
Paper H&P to be scanned into permanent record. H&P reviewed. No changes. 

## 2016-05-29 NOTE — Anesthesia Procedure Notes (Signed)
Anesthesia Regional Block:  Interscalene brachial plexus block  Pre-Anesthetic Checklist: ,, timeout performed, Correct Patient, Correct Site, Correct Laterality, Correct Procedure, Correct Position, site marked, Risks and benefits discussed,  Surgical consent,  Pre-op evaluation,  At surgeon's request and post-op pain management  Laterality: Right  Prep: chloraprep       Needles:  Injection technique: Single-shot  Needle Type: Stimiplex     Needle Length: 9cm 9 cm Needle Gauge: 22 and 22 G    Additional Needles:  Procedures: ultrasound guided (picture in chart) Interscalene brachial plexus block Narrative:  Start time: 05/29/2016 10:55 AM End time: 05/29/2016 11:05 AM Injection made incrementally with aspirations every 5 mL.  Performed by: Personally  Anesthesiologist: Devean Skoczylas  Additional Notes: Functioning IV was confirmed and monitors were applied.  A 26mm 22ga Stimuplex needle was used. Sterile prep and drape,hand hygiene and sterile gloves were used.  Negative aspiration and negative test dose prior to incremental administration of local anesthetic. The patient tolerated the procedure well.

## 2016-05-29 NOTE — Anesthesia Procedure Notes (Signed)
Procedure Name: Intubation Date/Time: 05/29/2016 12:22 PM Performed by: Randa Lynn, AMY Pre-anesthesia Checklist: Patient identified, Patient being monitored, Timeout performed, Emergency Drugs available and Suction available Patient Re-evaluated:Patient Re-evaluated prior to inductionOxygen Delivery Method: Circle system utilized Preoxygenation: Pre-oxygenation with 100% oxygen Intubation Type: IV induction Ventilation: Mask ventilation without difficulty Laryngoscope Size: Mac and 3 Grade View: Grade II Tube type: Oral Tube size: 7.0 mm Number of attempts: 1 Airway Equipment and Method: Stylet Placement Confirmation: ETT inserted through vocal cords under direct vision,  positive ETCO2 and breath sounds checked- equal and bilateral Secured at: 22 cm Tube secured with: Tape Dental Injury: Teeth and Oropharynx as per pre-operative assessment

## 2016-05-29 NOTE — Discharge Instructions (Addendum)
Keep dressing dry and intact.  May shower after dressing changed on post-op day #4 (Sunday).  Cover staples with Band-Aids after drying off. Apply ice frequently to shoulder. Take ibuprofen 800 mg TID with meals for 7-10 days, then as necessary. Take oxycodone as prescribed when needed.  May supplement with ES Tylenol if necessary. Keep shoulder immobilizer on at all times except may remove for bathing purposes. Follow-up in 10-14 days or as scheduled.  AMBULATORY SURGERY  DISCHARGE INSTRUCTIONS   1) The drugs that you were given will stay in your system until tomorrow so for the next 24 hours you should not:  A) Drive an automobile B) Make any legal decisions C) Drink any alcoholic beverage   2) You may resume regular meals tomorrow.  Today it is better to start with liquids and gradually work up to solid foods.  You may eat anything you prefer, but it is better to start with liquids, then soup and crackers, and gradually work up to solid foods.   3) Please notify your doctor immediately if you have any unusual bleeding, trouble breathing, redness and pain at the surgery site, drainage, fever, or pain not relieved by medication.    4) Additional Instructions:        Please contact your physician with any problems or Same Day Surgery at [phone], Monday through Friday 6 am to 4 pm, or [place] [facility] number at [phone].

## 2016-05-29 NOTE — Transfer of Care (Signed)
Immediate Anesthesia Transfer of Care Note  Patient: Summer Buckley  Procedure(s) Performed: Procedure(s): SHOULDER ARTHROSCOPY WITH MINI OPEN BICEPS TENDON REPAIR (Right) SHOULDER ARTHROSCOPY WITH SUBACROMIAL DECOMPRESSION AND DEBRIDEMENT (Right)  Patient Location: PACU  Anesthesia Type:General  Level of Consciousness: awake, alert  and oriented  Airway & Oxygen Therapy: Patient connected to face mask oxygen  Post-op Assessment: Post -op Vital signs reviewed and stable  Post vital signs: stable  Last Vitals:  Vitals:   05/29/16 1210 05/29/16 1349  BP:  (!) 139/98  Pulse: 81 (!) 118  Resp: 14 16  Temp:  36.2 C    Last Pain:  Vitals:   05/29/16 1349  TempSrc: Temporal  PainSc:          Complications: No apparent anesthesia complications

## 2016-05-29 NOTE — Anesthesia Preprocedure Evaluation (Signed)
Anesthesia Evaluation  Patient identified by MRN, date of birth, ID band Patient awake    Reviewed: Allergy & Precautions, NPO status , Patient's Chart, lab work & pertinent test results  History of Anesthesia Complications Negative for: history of anesthetic complications  Airway Mallampati: II  TM Distance: >3 FB Neck ROM: Full    Dental no notable dental hx.    Pulmonary neg pulmonary ROS, neg sleep apnea, neg COPD,    breath sounds clear to auscultation- rhonchi (-) wheezing      Cardiovascular Exercise Tolerance: Good (-) hypertension(-) CAD and (-) Past MI  Rhythm:Regular Rate:Normal - Systolic murmurs and - Diastolic murmurs    Neuro/Psych  Headaches, Anxiety Depression    GI/Hepatic Neg liver ROS, GERD  ,  Endo/Other  negative endocrine ROSneg diabetes  Renal/GU negative Renal ROS     Musculoskeletal  (+) Arthritis , Fibromyalgia -  Abdominal (+) - obese,   Peds  Hematology negative hematology ROS (+)   Anesthesia Other Findings Past Medical History: No date: ADHD (attention deficit hyperactivity disorder) No date: Anxiety No date: Arthritis No date: Bruises easily No date: Bulging lumbar disc No date: Chronic back pain No date: Depression No date: Endometriosis No date: Fibromyalgia No date: GERD (gastroesophageal reflux disease)     Comment: OCC-NO MEDS No date: Headache 06/2015: History of methicillin resistant staphylococcu*     Comment: + PCR AND MRSA ON NECK No date: PTSD (post-traumatic stress disorder) No date: Throat injury     Comment: PT STATES THAT SHE WAS STRANGLED YEARS AGO AND              NOW HAS PROBLEMS WITH HER THROAT               SWELLING-NORMALLY HAPPENS ONCE YEARLY -LAST               HAPPENED IN JULY 2017   Reproductive/Obstetrics                             Anesthesia Physical Anesthesia Plan  ASA: II  Anesthesia Plan: General   Post-op  Pain Management:  Regional for Post-op pain   Induction: Intravenous  Airway Management Planned: Oral ETT  Additional Equipment:   Intra-op Plan:   Post-operative Plan: Extubation in OR  Informed Consent: I have reviewed the patients History and Physical, chart, labs and discussed the procedure including the risks, benefits and alternatives for the proposed anesthesia with the patient or authorized representative who has indicated his/her understanding and acceptance.   Dental advisory given  Plan Discussed with: CRNA and Anesthesiologist  Anesthesia Plan Comments:         Anesthesia Quick Evaluation

## 2016-05-29 NOTE — Op Note (Signed)
05/29/2016  1:53 PM  Patient:   Summer Buckley  Pre-Op Diagnosis:   Impingement/tendinopathy with possible partial thickness rotator cuff tear, right shoulder.  Postoperative diagnosis: Impingement/tendinopathy with labral fraying and biceps tendinopathy, right shoulder.  Procedure: Limited arthroscopic debridement, arthroscopic subacromial decompression, and mini-open biceps tenodesis, right shoulder.  Anesthesia: General endotracheal with interscalene block placed preoperatively by the anesthesiologist.  Surgeon:   Pascal Lux, MD  Assistant:   None  Findings: As above. The labrum demonstrated fraying anteriorly, but there was no frank glenoid detachment. The rotator cuff was in excellent condition, as were the articular surfaces of the glenoid and humerus. There were areas of inflammation along the biceps tendon.  Complications: None  Fluids:   900 cc  Estimated blood loss: 10 cc  Tourniquet time: None  Drains: None  Closure: Staples   Brief clinical note: The patient is a 30 year old female with a history of right shoulder pain. The patient's symptoms have progressed despite medications, activity modification, etc. The patient's history and examination are consistent with impingement/tendinopathy with a possible rotator cuff tear. An MRI scan demonstrated impingement with a possible bursal surface partial thickness tear of the supraspinatus tendon. The patient presents at this time for definitive management of these shoulder symptoms.  Procedure: The patient underwent placement of an interscalene block by the anesthesiologist in the preoperative holding area before she was brought into the operating room and lain in the supine position. The patient then underwent general endotracheal intubation and anesthesia before being repositioned in the beach chair position using the beach chair positioner. The right shoulder and upper extremity were  prepped with ChloraPrep solution before being draped sterilely. Preoperative antibiotics were administered. A timeout was performed to confirm the proper surgical site before the expected portal sites and incision site were injected with 0.5% Sensorcaine with epinephrine. A posterior portal was created and the glenohumeral joint thoroughly inspected with the findings as described above. An anterior portal was created using an outside-in technique. The labrum and rotator cuff were further probed, again confirming the above-noted findings. The areas of labral fraying was debrided using the full-radius resector, as were several areas of synovitis. The ArthroCare wand was inserted and used to obtain hemostasis as well as to "anneal" the labrum anteriorly. The instruments were removed from the joint after suctioning the excess fluid.  The camera was repositioned through the posterior portal into the subacromial space. A separate lateral portal was created using an outside-in technique. The 3.5 mm full-radius resector was introduced and used to perform a subtotal bursectomy. The ArthroCare wand was then inserted and used to remove the periosteal tissue off the undersurface of the anterior third of the acromion as well as to recess the coracoacromial ligament from its attachment along the anterior and lateral margins of the acromion. The 4.0 mm acromionizing bur was introduced and used to complete the decompression by removing the undersurface of the anterior third of the acromion. The full radius resector was reintroduced to remove any residual bony debris before the ArthroCare wand was reintroduced to obtain hemostasis. The instruments were then removed from the subacromial space after suctioning the excess fluid.  An approximately 3.5-4 cm incision was made over the anterolateral aspect of the shoulder beginning at the anterolateral corner of the acromion and extending distally in line with the bicipital groove.  This incision was carried down through the subcutaneous tissues to expose the deltoid fascia. The raphae between the anterior and middle thirds was identified and this  plane developed to provide access into the subacromial space. Additional bursal tissues were debrided sharply using Metzenbaum scissors. The rotator cuff tear was carefully inspected both visually and by palpation. There was no evidence for any partial or full-thickness rotator cuff tears.  The bicipital groove was identified by palpation and opened for 1-1.5 cm. The biceps tendon stump was retrieved through this defect. The floor of the bicipital groove was roughened with a curet before a single Biomet 2.9 mm JuggerKnot anchor was inserted. Both sets of sutures were passed through the biceps tendon and tied securely to effect the tenodesis. The bicipital sheath was reapproximated using two #0 Ethibond interrupted sutures, incorporating the biceps tendon to further reinforce the tenodesis.  The wound was copiously irrigated with sterile saline solution before the deltoid raphae was reapproximated using 2-0 Vicryl interrupted sutures. The subcutaneous tissues were closed in two layers using 2-0 Vicryl interrupted sutures before the skin was closed using staples. The portal sites also were closed using staples. A sterile bulky dressing was applied to the shoulder before the arm was placed into a shoulder immobilizer. The patient was then awakened, extubated, and returned to the recovery room in satisfactory condition after tolerating the procedure well.

## 2016-06-03 NOTE — Anesthesia Postprocedure Evaluation (Signed)
Anesthesia Post Note  Patient: Summer Buckley  Procedure(s) Performed: Procedure(s) (LRB): SHOULDER ARTHROSCOPY WITH MINI OPEN BICEPS TENDON REPAIR (Right) SHOULDER ARTHROSCOPY WITH SUBACROMIAL DECOMPRESSION AND DEBRIDEMENT (Right)  Patient location during evaluation: PACU Anesthesia Type: General and Regional Level of consciousness: awake and alert Pain management: pain level controlled Vital Signs Assessment: post-procedure vital signs reviewed and stable Respiratory status: spontaneous breathing, nonlabored ventilation, respiratory function stable and patient connected to nasal cannula oxygen Cardiovascular status: blood pressure returned to baseline and stable Postop Assessment: no signs of nausea or vomiting Anesthetic complications: no     Last Vitals:  Vitals:   05/29/16 1457 05/29/16 1558  BP: 118/82 120/84  Pulse: 90 89  Resp: 16 16  Temp: 36.8 C     Last Pain:  Vitals:   05/29/16 1729  TempSrc:   PainSc: 5                  Martha Clan

## 2016-06-05 ENCOUNTER — Encounter: Payer: Self-pay | Admitting: Surgery

## 2016-10-28 ENCOUNTER — Emergency Department: Payer: Medicaid Other

## 2016-10-28 ENCOUNTER — Encounter: Payer: Self-pay | Admitting: Emergency Medicine

## 2016-10-28 ENCOUNTER — Emergency Department
Admission: EM | Admit: 2016-10-28 | Discharge: 2016-10-28 | Disposition: A | Discharge status: Home / Self Care | Payer: Medicaid Other | Attending: Emergency Medicine | Admitting: Emergency Medicine

## 2016-10-28 DIAGNOSIS — Y929 Unspecified place or not applicable: Secondary | ICD-10-CM | POA: Diagnosis not present

## 2016-10-28 DIAGNOSIS — F909 Attention-deficit hyperactivity disorder, unspecified type: Secondary | ICD-10-CM | POA: Diagnosis not present

## 2016-10-28 DIAGNOSIS — W010XXA Fall on same level from slipping, tripping and stumbling without subsequent striking against object, initial encounter: Secondary | ICD-10-CM | POA: Insufficient documentation

## 2016-10-28 DIAGNOSIS — S8012XA Contusion of left lower leg, initial encounter: Secondary | ICD-10-CM | POA: Diagnosis not present

## 2016-10-28 DIAGNOSIS — S8002XA Contusion of left knee, initial encounter: Secondary | ICD-10-CM | POA: Insufficient documentation

## 2016-10-28 DIAGNOSIS — Y999 Unspecified external cause status: Secondary | ICD-10-CM | POA: Diagnosis not present

## 2016-10-28 DIAGNOSIS — Y939 Activity, unspecified: Secondary | ICD-10-CM | POA: Insufficient documentation

## 2016-10-28 DIAGNOSIS — S8992XA Unspecified injury of left lower leg, initial encounter: Secondary | ICD-10-CM | POA: Diagnosis present

## 2016-10-28 MED ORDER — OXYCODONE-ACETAMINOPHEN 7.5-325 MG PO TABS: 1.0000 | ORAL_TABLET | Freq: Four times a day (QID) | ORAL | 0 refills | Status: DC | PRN

## 2016-10-28 MED ORDER — KETOROLAC TROMETHAMINE 60 MG/2ML IM SOLN
60.0000 mg | Freq: Once | INTRAMUSCULAR | Status: AC
  Administered 2016-10-28: 60 mg via INTRAMUSCULAR
  Filled 2016-10-28: qty 2

## 2016-10-28 MED ORDER — HYDROMORPHONE HCL 1 MG/ML IJ SOLN
1.0000 mg | Freq: Once | INTRAMUSCULAR | Status: AC
  Administered 2016-10-28: 1 mg via INTRAMUSCULAR
  Filled 2016-10-28: qty 1

## 2016-10-28 MED ORDER — IBUPROFEN 600 MG PO TABS: 600.0000 mg | ORAL_TABLET | Freq: Three times a day (TID) | ORAL | 0 refills | Status: DC | PRN

## 2016-10-28 NOTE — ED Provider Notes (Signed)
[facility] Emergency Department Provider Note   ____________________________________________   First MD Initiated Contact with Patient 10/28/16 1323     (approximate)  I have reviewed the triage vital signs and the nursing notes.   HISTORY  Chief Complaint Fall    HPI Summer Buckley is a 31 y.o. female patient complaining of left knee pain secondary to a trip and fall. Patient states she fell after tripping gas line holes in her knee on concrete. Patient unable to bear weight since the incident.patient rates the pain as a 10 over 10. Patient described a pain as "sharp". No palliative measures prior to arrival.   Past Medical History:  Diagnosis Date  . ADHD (attention deficit hyperactivity disorder)   . Anxiety   . Arthritis   . Bruises easily   . Bulging lumbar disc   . Chronic back pain   . Depression   . Endometriosis   . Fibromyalgia   . GERD (gastroesophageal reflux disease)    OCC-NO MEDS  . Headache   . 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 PTSD (post-traumatic stress disorder)   . Throat injury    PT STATES THAT SHE WAS STRANGLED YEARS AGO AND NOW HAS PROBLEMS WITH HER THROAT SWELLING-NORMALLY HAPPENS ONCE YEARLY -LAST HAPPENED IN JULY 2017    Patient Active Problem List   Diagnosis Date Noted  . Shortness of breath 02/07/2016    Past Surgical History:  Procedure Laterality Date  . ABDOMINAL HYSTERECTOMY    . APPENDECTOMY    . BACK SURGERY    . EXPLORATORY LAPAROTOMY    . SHOULDER ARTHROSCOPY WITH BICEPS TENDON REPAIR Right 05/29/2016   Procedure: SHOULDER ARTHROSCOPY WITH MINI OPEN BICEPS TENDON REPAIR;  Surgeon: Corky Mull, MD;  Location: [facility] ORS;  Service: Orthopedics;  Laterality: Right;  . SHOULDER ARTHROSCOPY WITH SUBACROMIAL DECOMPRESSION Right 05/29/2016   Procedure: SHOULDER ARTHROSCOPY WITH SUBACROMIAL DECOMPRESSION AND DEBRIDEMENT;  Surgeon: Corky Mull, MD;  Location: [facility] ORS;  Service: Orthopedics;  Laterality: Right;  . TONSILLECTOMY      Prior to Admission medications   Medication Sig Start Date End Date Taking? Authorizing Provider  albuterol (PROVENTIL HFA;VENTOLIN HFA) 108 (90 Base) MCG/ACT inhaler Inhale 2 puffs into the lungs every 6 (six) hours as needed for wheezing or shortness of breath. 02/08/16   Epifanio Lesches, MD  ALPRAZolam Duanne Moron) 1 MG tablet Take 1 mg by mouth 4 (four) times daily.     [provider]  amphetamine-dextroamphetamine (ADDERALL) 30 MG tablet Take 15-30 mg by mouth 2 (two) times daily. 30 mg in the morning (scheduled) & 15 mg or 30 mg in the afternoon (up to patient discretion)    [provider]  buPROPion (WELLBUTRIN SR) 200 MG 12 hr tablet Take 200 mg by mouth every morning.    [provider]  cyclobenzaprine (FLEXERIL) 5 MG tablet Take 1-2 tablets (5-10 mg total) by mouth 3 (three) times daily as needed for muscle spasms. 05/29/16   Poggi, Marshall Cork, MD  EPINEPHrine 0.3 mg/0.3 mL IJ SOAJ injection Inject 0.3 mg into the muscle daily as needed for anaphylaxis. 02/13/16   [provider]  estrogens, conjugated, (PREMARIN) 1.25 MG tablet Take 1.25 mg by mouth daily.    [provider]  ibuprofen (ADVIL,MOTRIN) 600 MG tablet Take 1 tablet (600 mg total) by mouth every 8 (eight) hours as needed. 10/28/16   Mardee Postin  K, PA-C  ibuprofen (ADVIL,MOTRIN) 800 MG tablet Take 800 mg by mouth 3 (three) times daily as needed for pain. 05/14/16   [provider]  oxyCODONE (ROXICODONE) 5 MG immediate release tablet Take 1-2 tablets (5-10 mg total) by mouth every 4 (four) hours as needed for severe pain. 05/29/16   Poggi, Marshall Cork, MD  oxyCODONE-acetaminophen (PERCOCET) 7.5-325 MG tablet Take 1 tablet by mouth every 6 (six) hours as needed for severe pain. 10/28/16   Sable Feil, PA-C  prazosin (MINIPRESS) 2 MG capsule Take 2 mg by mouth at bedtime. 05/06/16    [provider]  QUEtiapine (SEROQUEL) 50 MG tablet Take 50 mg by mouth 2 (two) times daily as needed for anxiety or sleep. 02/21/16   [provider]  triamcinolone cream (KENALOG) 0.1 % Apply 1 application topically 2 (two) times daily as needed. For skin rashes. 03/11/16   [provider]    Allergies Tape  Family History  Problem Relation Age of Onset  . Hypertension Mother   . Hyperlipidemia Mother   . Hypertension Father   . Hyperlipidemia Father   . Diabetes Father     Social History Social History  Substance Use Topics  . Smoking status: Never Smoker  . Smokeless tobacco: Never Used  . Alcohol use No    Review of Systems Constitutional: No fever/chills Eyes: No visual changes. ENT: No sore throat. Cardiovascular: Denies chest pain. Respiratory: Denies shortness of breath. Gastrointestinal: No abdominal pain.  No nausea, no vomiting.  No diarrhea.  No constipation. Genitourinary: Negative for dysuria. Musculoskeletal: left knee pain Skin: Negative for rash. Neurological: Negative for headaches, focal weakness or numbness. Psychiatric:ADHD, anxiety, depression and PTSD. Allergic/Immunilogical: tape ____________________________________________   PHYSICAL EXAM:  VITAL SIGNS: ED Triage Vitals  Enc Vitals Group     BP 10/28/16 1300 109/77     Pulse Rate 10/28/16 1300 89     Resp 10/28/16 1300 16     Temp 10/28/16 1300 98.7 F (37.1 C)     Temp Source 10/28/16 1300 Oral     SpO2 10/28/16 1300 100 %     Weight 10/28/16 1258 158 lb (71.7 kg)     Height 10/28/16 1258 5\' 5"  (1.651 m)     Head Circumference --      Peak Flow --      Pain Score 10/28/16 1257 10     Pain Loc --      Pain Edu? --      Excl. in [place]? --     Constitutional: Alert and oriented. Well appearing and in no acute distress. Neck: No stridor.  No cervical spine tenderness to palpation. Hematological/Lymphatic/Immunilogical: No cervical  lymphadenopathy. Cardiovascular: Normal rate, regular rhythm. Grossly normal heart sounds.  Good peripheral circulation. Respiratory: Normal respiratory effort.  No retractions. Lungs CTAB. Gastrointestinal: Soft and nontender. No distention. No abdominal bruits. No CVA tenderness. Musculoskeletal: no obvious deformity to the left knee. Moderate guarding with palpation of the inferior patella. Decreased range of motion with flexion of the left lower extremity. Patient refused weightbearing at this time. Neurologic:  Normal speech and language. No gross focal neurologic deficits are appreciated. No gait instability. Skin:  Skin is warm, dry and intact. No rash noted.No abrasion or ecchymosis. Psychiatric: Mood and affect are normal. Speech and behavior are normal.  ____________________________________________   LABS (all labs ordered are listed, but only abnormal results are displayed)  Labs Reviewed - No data to display ____________________________________________  EKG  ____________________________________________  RADIOLOGY  No acute findings x-ray of the left knee. ____________________________________________   PROCEDURES  Procedure(s) performed: None  Procedures  Critical Care performed: No  ____________________________________________   INITIAL IMPRESSION / ASSESSMENT AND PLAN / ED COURSE  Pertinent labs & imaging results that were available during my care of the patient were reviewed by me and considered in my medical decision making (see chart for details).  Knee pain secondary to contusion status post trip and fall. Discussed x-ray finding with patient. Patient given discharge care instructions. Patient placed in knee immobilizer. Patient advised follow-up with family doctor if no improvement 3-5 days.      ____________________________________________   FINAL CLINICAL IMPRESSION(S) / ED DIAGNOSES  Final diagnoses:  Contusion of left knee and lower leg,  initial encounter      NEW MEDICATIONS STARTED DURING THIS VISIT:  New Prescriptions   IBUPROFEN (ADVIL,MOTRIN) 600 MG TABLET    Take 1 tablet (600 mg total) by mouth every 8 (eight) hours as needed.   OXYCODONE-ACETAMINOPHEN (PERCOCET) 7.5-325 MG TABLET    Take 1 tablet by mouth every 6 (six) hours as needed for severe pain.     Note:  This document was prepared using Dragon voice recognition software and may include unintentional dictation errors.    Sable Feil, PA-C 10/28/16 1353    Harvest Dark, MD 10/28/16 [phone]

## 2016-10-28 NOTE — Discharge Instructions (Signed)
Weight knee support and ambulate for crutches for 3-5 days as needed.

## 2016-10-28 NOTE — ED Triage Notes (Signed)
Tripped on gas pump hose, fell onto concrete.  C/O left knee pain.  States unable to bear weight.

## 2017-01-30 ENCOUNTER — Encounter: Payer: Self-pay | Admitting: *Deleted

## 2017-01-30 ENCOUNTER — Ambulatory Visit
Admission: EM | Admit: 2017-01-30 | Discharge: 2017-01-30 | Disposition: A | Discharge status: Home / Self Care | Payer: Medicaid Other | Attending: Family Medicine | Admitting: Family Medicine

## 2017-01-30 DIAGNOSIS — Z8614 Personal history of Methicillin resistant Staphylococcus aureus infection: Secondary | ICD-10-CM

## 2017-01-30 DIAGNOSIS — L0100 Impetigo, unspecified: Secondary | ICD-10-CM

## 2017-01-30 MED ORDER — TRAMADOL HCL 50 MG PO TABS: 50.0000 mg | ORAL_TABLET | Freq: Three times a day (TID) | ORAL | 0 refills | Status: DC | PRN

## 2017-01-30 MED ORDER — SULFAMETHOXAZOLE-TRIMETHOPRIM 800-160 MG PO TABS: 1.0000 | ORAL_TABLET | Freq: Two times a day (BID) | ORAL | 0 refills | Status: AC

## 2017-01-30 MED ORDER — MUPIROCIN 2 % EX OINT: TOPICAL_OINTMENT | CUTANEOUS | 0 refills | Status: DC
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
## 2017-01-30 NOTE — ED Triage Notes (Signed)
Patient popped a abscess on the right side of her face next to her nose 2 days ago. The abscess is currently red with swelling.

## 2017-01-30 NOTE — Discharge Instructions (Signed)
Take medication as prescribed. Keep clean. Drink plenty of fluids.   Follow up with your primary care physician this week as needed. Return to Urgent care for new or worsening concerns.

## 2017-04-06 ENCOUNTER — Emergency Department: Payer: Medicaid Other

## 2017-04-06 ENCOUNTER — Other Ambulatory Visit: Payer: Self-pay

## 2017-04-06 ENCOUNTER — Emergency Department
Admission: EM | Admit: 2017-04-06 | Discharge: 2017-04-06 | Disposition: A | Discharge status: Home / Self Care | Payer: Medicaid Other | Attending: Emergency Medicine | Admitting: Emergency Medicine

## 2017-04-06 DIAGNOSIS — R079 Chest pain, unspecified: Secondary | ICD-10-CM | POA: Diagnosis not present

## 2017-04-06 DIAGNOSIS — W19XXXA Unspecified fall, initial encounter: Secondary | ICD-10-CM

## 2017-04-06 DIAGNOSIS — Z79899 Other long term (current) drug therapy: Secondary | ICD-10-CM | POA: Insufficient documentation

## 2017-04-06 DIAGNOSIS — G8929 Other chronic pain: Secondary | ICD-10-CM

## 2017-04-06 DIAGNOSIS — M546 Pain in thoracic spine: Secondary | ICD-10-CM | POA: Diagnosis not present

## 2017-04-06 DIAGNOSIS — R0602 Shortness of breath: Secondary | ICD-10-CM | POA: Diagnosis not present

## 2017-04-06 LAB — CBC
HCT: 39.1 % (ref 35.0–47.0)
HEMOGLOBIN: 13.2 g/dL (ref 12.0–16.0)
MCH: 29.2 pg (ref 26.0–34.0)
MCHC: 33.8 g/dL (ref 32.0–36.0)
MCV: 86.3 fL (ref 80.0–100.0)
PLATELETS: 299 10*3/uL (ref 150–440)
RBC: 4.53 MIL/uL (ref 3.80–5.20)
RDW: 13.4 % (ref 11.5–14.5)
WBC: 5.8 10*3/uL (ref 3.6–11.0)

## 2017-04-06 LAB — BASIC METABOLIC PANEL
ANION GAP: 8 (ref 5–15)
BUN: 8 mg/dL (ref 6–20)
CALCIUM: 8.9 mg/dL (ref 8.9–10.3)
CO2: 26 mmol/L (ref 22–32)
CREATININE: 0.76 mg/dL (ref 0.44–1.00)
Chloride: 102 mmol/L (ref 101–111)
GFR calc non Af Amer: >60 mL/min (ref >60)
Glucose, Bld: 112 mg/dL — ABNORMAL HIGH (ref 65–99)
Potassium: 3.5 mmol/L (ref 3.5–5.1)
SODIUM: 136 mmol/L (ref 135–145)

## 2017-04-06 LAB — TROPONIN I

## 2017-04-06 MED ORDER — IPRATROPIUM-ALBUTEROL 0.5-2.5 (3) MG/3ML IN SOLN: 3.0000 mL | Freq: Once | RESPIRATORY_TRACT | Status: DC

## 2017-04-06 MED ORDER — OXYCODONE-ACETAMINOPHEN 5-325 MG PO TABS
ORAL_TABLET | ORAL | Status: AC
  Filled 2017-04-06: qty 2

## 2017-04-06 MED ORDER — DEXAMETHASONE SODIUM PHOSPHATE 10 MG/ML IJ SOLN: 10.0000 mg | Freq: Once | INTRAMUSCULAR | Status: DC

## 2017-04-06 MED ORDER — OXYCODONE-ACETAMINOPHEN 5-325 MG PO TABS
2.0000 | ORAL_TABLET | Freq: Once | ORAL | Status: AC
  Administered 2017-04-06: 2 via ORAL

## 2017-04-06 MED ORDER — LIDOCAINE 5 % EX PTCH: 1.0000 | MEDICATED_PATCH | Freq: Two times a day (BID) | CUTANEOUS | 0 refills | Status: DC

## 2017-04-06 MED ORDER — HYDROCODONE-ACETAMINOPHEN 5-325 MG PO TABS: 1.0000 | ORAL_TABLET | ORAL | 0 refills | Status: DC | PRN

## 2017-04-06 NOTE — ED Notes (Signed)
Pt took 7.5mg  oxycodone PTA. States that it is the last one she had.

## 2017-04-06 NOTE — ED Triage Notes (Signed)
Pt arrives to ER via POV c/o SOB and chest pain that developed after she fell yesterday. States she had trip and fall yesterday, lost consciousness and awoke with severe upper back pain. States since back pain began she has had CP and SOB. Pt hx of back problems. Pt states pain is severe. Pt alert and oriented X4, active, cooperative, pt in NAD. RR even and unlabored, color WNL.  Unwitnessed fall. No complaints of head pain.

## 2017-04-06 NOTE — ED Notes (Signed)
States pain to chest has resolved.  Continues to c/o back pain.  Patient posture relaxed.  MAE equally and strong.  Respirations regular and non labored.  NAD

## 2017-04-06 NOTE — ED Notes (Signed)
AAOx3.  Skin warm and dry. NAD.  Ambulates with easy and steady gait.   

## 2017-04-06 NOTE — ED Notes (Signed)
AAOx3.  Skin warm and dry..  Ambulates with easy and steady gait.  C/O thoracic back pain due to fall.  States pain worse with movement and breathing.  No SOB/ DOE.  Has taken Percocet 7.5 mg, Motrin 800 mg, and Cradum at home with no relief of symptoms.
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
## 2017-04-18 ENCOUNTER — Encounter: Payer: Self-pay | Admitting: Gynecology

## 2017-04-18 ENCOUNTER — Ambulatory Visit
Admission: EM | Admit: 2017-04-18 | Discharge: 2017-04-18 | Disposition: A | Discharge status: Home / Self Care | Payer: Medicaid Other | Attending: Emergency Medicine | Admitting: Emergency Medicine

## 2017-04-18 DIAGNOSIS — Z8614 Personal history of Methicillin resistant Staphylococcus aureus infection: Secondary | ICD-10-CM | POA: Insufficient documentation

## 2017-04-18 DIAGNOSIS — Z202 Contact with and (suspected) exposure to infections with a predominantly sexual mode of transmission: Secondary | ICD-10-CM | POA: Insufficient documentation

## 2017-04-18 DIAGNOSIS — F909 Attention-deficit hyperactivity disorder, unspecified type: Secondary | ICD-10-CM | POA: Insufficient documentation

## 2017-04-18 DIAGNOSIS — F329 Major depressive disorder, single episode, unspecified: Secondary | ICD-10-CM | POA: Diagnosis not present

## 2017-04-18 DIAGNOSIS — M549 Dorsalgia, unspecified: Secondary | ICD-10-CM | POA: Insufficient documentation

## 2017-04-18 DIAGNOSIS — N760 Acute vaginitis: Secondary | ICD-10-CM | POA: Diagnosis not present

## 2017-04-18 DIAGNOSIS — F4310 Post-traumatic stress disorder, unspecified: Secondary | ICD-10-CM | POA: Diagnosis not present

## 2017-04-18 DIAGNOSIS — K219 Gastro-esophageal reflux disease without esophagitis: Secondary | ICD-10-CM | POA: Insufficient documentation

## 2017-04-18 DIAGNOSIS — B9689 Other specified bacterial agents as the cause of diseases classified elsewhere: Secondary | ICD-10-CM

## 2017-04-18 DIAGNOSIS — Z90710 Acquired absence of both cervix and uterus: Secondary | ICD-10-CM | POA: Diagnosis not present

## 2017-04-18 DIAGNOSIS — M797 Fibromyalgia: Secondary | ICD-10-CM | POA: Insufficient documentation

## 2017-04-18 DIAGNOSIS — G8929 Other chronic pain: Secondary | ICD-10-CM | POA: Diagnosis not present

## 2017-04-18 DIAGNOSIS — Z113 Encounter for screening for infections with a predominantly sexual mode of transmission: Secondary | ICD-10-CM | POA: Diagnosis not present

## 2017-04-18 LAB — CHLAMYDIA/NGC RT PCR (ARMC ONLY)
Chlamydia Tr: NOT DETECTED
N gonorrhoeae: NOT DETECTED

## 2017-04-18 LAB — WET PREP, GENITAL
Sperm: NONE SEEN
Trich, Wet Prep: NONE SEEN
YEAST WET PREP: NONE SEEN

## 2017-04-18 MED ORDER — METRONIDAZOLE 500 MG PO TABS: 500.0000 mg | ORAL_TABLET | Freq: Two times a day (BID) | ORAL | 0 refills | Status: AC

## 2017-04-18 MED ORDER — CEFTRIAXONE SODIUM 250 MG IJ SOLR
250.0000 mg | Freq: Once | INTRAMUSCULAR | Status: AC
  Administered 2017-04-18: 250 mg via INTRAMUSCULAR

## 2017-04-18 MED ORDER — AZITHROMYCIN 500 MG PO TABS
1000.0000 mg | ORAL_TABLET | Freq: Once | ORAL | Status: AC
  Administered 2017-04-18: 1000 mg via ORAL
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
## 2017-04-18 NOTE — ED Triage Notes (Signed)
Per patient boyfriend told her  Last night that he has been  diagnose with Gonorrhea.

## 2017-04-18 NOTE — Discharge Instructions (Signed)
Take the medication as written. Give [place] a working phone number so that we can contact you if needed. Refrain from sexual contact until you know your results and your partner(s) are treated if necessary. Return to the ER if you get worse, have a fever >100.4, or for any concerns.   Go to www.goodrx.com to look up your medications. This will give you a list of where you can find your prescriptions at the most affordable prices. Or ask the pharmacist what the cash price is, or if they have any other discount programs available to help make your medication more affordable. This can be less expensive than what you would pay with insurance.

## 2017-04-20 LAB — HIV ANTIBODY (ROUTINE TESTING W REFLEX): HIV Screen 4th Generation wRfx: NONREACTIVE

## 2017-04-20 LAB — RPR: RPR Ser Ql: NONREACTIVE

## 2017-10-04 ENCOUNTER — Emergency Department: Payer: Medicaid Other

## 2017-10-04 ENCOUNTER — Other Ambulatory Visit: Payer: Self-pay

## 2017-10-04 ENCOUNTER — Encounter: Payer: Self-pay | Admitting: Emergency Medicine

## 2017-10-04 ENCOUNTER — Emergency Department
Admission: EM | Admit: 2017-10-04 | Discharge: 2017-10-04 | Disposition: A | Discharge status: Home / Self Care | Payer: Medicaid Other | Attending: Emergency Medicine | Admitting: Emergency Medicine

## 2017-10-04 DIAGNOSIS — R1030 Lower abdominal pain, unspecified: Secondary | ICD-10-CM | POA: Insufficient documentation

## 2017-10-04 DIAGNOSIS — Z79899 Other long term (current) drug therapy: Secondary | ICD-10-CM | POA: Diagnosis not present

## 2017-10-04 DIAGNOSIS — R1084 Generalized abdominal pain: Secondary | ICD-10-CM | POA: Diagnosis not present

## 2017-10-04 DIAGNOSIS — R197 Diarrhea, unspecified: Secondary | ICD-10-CM | POA: Insufficient documentation

## 2017-10-04 DIAGNOSIS — R112 Nausea with vomiting, unspecified: Secondary | ICD-10-CM | POA: Diagnosis not present

## 2017-10-04 LAB — COMPREHENSIVE METABOLIC PANEL
ALT: 33 U/L (ref 14–54)
AST: 39 U/L (ref 15–41)
Albumin: 4.4 g/dL (ref 3.5–5.0)
Alkaline Phosphatase: 106 U/L (ref 38–126)
Anion gap: 9 (ref 5–15)
BUN: 12 mg/dL (ref 6–20)
CHLORIDE: 102 mmol/L (ref 101–111)
CO2: 26 mmol/L (ref 22–32)
CREATININE: 0.72 mg/dL (ref 0.44–1.00)
Calcium: 9.1 mg/dL (ref 8.9–10.3)
Glucose, Bld: 111 mg/dL — ABNORMAL HIGH (ref 65–99)
Potassium: 3.4 mmol/L — ABNORMAL LOW (ref 3.5–5.1)
Sodium: 137 mmol/L (ref 135–145)
TOTAL PROTEIN: 7.9 g/dL (ref 6.5–8.1)
Total Bilirubin: 0.5 mg/dL (ref 0.3–1.2)

## 2017-10-04 LAB — URINALYSIS, COMPLETE (UACMP) WITH MICROSCOPIC
BILIRUBIN URINE: NEGATIVE
Bacteria, UA: NONE SEEN
Glucose, UA: NEGATIVE mg/dL
HGB URINE DIPSTICK: NEGATIVE
KETONES UR: 5 mg/dL — AB
LEUKOCYTES UA: NEGATIVE
NITRITE: NEGATIVE
PH: 5 (ref 5.0–8.0)
Protein, ur: NEGATIVE mg/dL
Specific Gravity, Urine: 1.011 (ref 1.005–1.030)
Squamous Epithelial / LPF: NONE SEEN (ref 0–5)

## 2017-10-04 LAB — CBC
HCT: 44.6 % (ref 35.0–47.0)
Hemoglobin: 15.2 g/dL (ref 12.0–16.0)
MCH: 29.6 pg (ref 26.0–34.0)
MCHC: 34.2 g/dL (ref 32.0–36.0)
MCV: 86.5 fL (ref 80.0–100.0)
PLATELETS: 321 10*3/uL (ref 150–440)
RBC: 5.15 MIL/uL (ref 3.80–5.20)
RDW: 13.5 % (ref 11.5–14.5)
WBC: 5.7 10*3/uL (ref 3.6–11.0)

## 2017-10-04 LAB — LIPASE, BLOOD: LIPASE: 22 U/L (ref 11–51)

## 2017-10-04 MED ORDER — ONDANSETRON HCL 4 MG/2ML IJ SOLN
4.0000 mg | Freq: Once | INTRAMUSCULAR | Status: AC | PRN
  Administered 2017-10-04: 4 mg via INTRAVENOUS
  Filled 2017-10-04: qty 2

## 2017-10-04 MED ORDER — IOPAMIDOL (ISOVUE-300) INJECTION 61%
30.0000 mL | Freq: Once | INTRAVENOUS | Status: AC | PRN
  Administered 2017-10-04: 30 mL via ORAL
  Filled 2017-10-04: qty 30

## 2017-10-04 MED ORDER — SODIUM CHLORIDE 0.9 % IV SOLN
Freq: Once | INTRAVENOUS | Status: AC
  Administered 2017-10-04: 17:00:00 via INTRAVENOUS

## 2017-10-04 MED ORDER — DICYCLOMINE HCL 10 MG/ML IM SOLN
20.0000 mg | Freq: Once | INTRAMUSCULAR | Status: AC
  Administered 2017-10-04: 20 mg via INTRAMUSCULAR
  Filled 2017-10-04: qty 2

## 2017-10-04 MED ORDER — IOPAMIDOL (ISOVUE-370) INJECTION 76%
75.0000 mL | Freq: Once | INTRAVENOUS | Status: AC | PRN
  Administered 2017-10-04: 75 mL via INTRAVENOUS
  Filled 2017-10-04: qty 75

## 2017-10-04 MED ORDER — SODIUM CHLORIDE 0.9 % IV BOLUS
1000.0000 mL | Freq: Once | INTRAVENOUS | Status: AC
  Administered 2017-10-04: 1000 mL via INTRAVENOUS

## 2017-10-04 MED ORDER — DICYCLOMINE HCL 10 MG PO CAPS: 10.0000 mg | ORAL_CAPSULE | Freq: Three times a day (TID) | ORAL | 0 refills | Status: DC | PRN

## 2017-10-04 MED ORDER — DIPHENHYDRAMINE HCL 50 MG/ML IJ SOLN
12.5000 mg | INTRAMUSCULAR | Status: AC
  Administered 2017-10-04: 12.5 mg via INTRAVENOUS
  Filled 2017-10-04: qty 1

## 2017-10-04 MED ORDER — ONDANSETRON 4 MG PO TBDP: ORAL_TABLET | ORAL | 0 refills | Status: DC

## 2017-10-04 MED ORDER — SODIUM CHLORIDE 0.9 % IV SOLN
Freq: Once | INTRAVENOUS | Status: AC
  Administered 2017-10-04: 18:00:00 via INTRAVENOUS

## 2017-10-04 MED ORDER — HALOPERIDOL LACTATE 5 MG/ML IJ SOLN
2.0000 mg | Freq: Once | INTRAMUSCULAR | Status: AC
  Administered 2017-10-04: 2 mg via INTRAVENOUS
  Filled 2017-10-04: qty 0.4
  Filled 2017-10-04: qty 1

## 2017-10-04 NOTE — ED Triage Notes (Signed)
Has had intermittent NVD since January.  Pt reports last 5 days has had constant NVD.  Diarrhea now watery per pt. Denies recent abx use.  No fevers.  Generalized abdominal pain from vomiting.  Tried some of her moms phenergan but still c/o nausea.  Sunken appearance around eyes

## 2017-10-04 NOTE — ED Notes (Signed)
Patient transported to CT 

## 2017-10-04 NOTE — Discharge Instructions (Signed)
We believe your symptoms are caused by either a viral infection or possible a bad food exposure.  Either way, since your symptoms have improved, we feel it is safe for you to go home and follow up with your regular doctor.  Please read the included information and stick to a bland diet for the next two days.  Drink plenty of clear fluids, and if you were provided with a prescription, please take it according to the label instructions.    Given the recurrent problems similar to this that you have been having over a period of months, however, we strongly encourage you to call the office of Dr. Vicente Males and schedule an appointment with him or 1 of his gastroenterology colleagues.  You need further evaluation by specialist to determine if you have any other ongoing issues in your GI system that could lead to your recurrent symptoms.  If you develop any new or worsening symptoms, including persistent vomiting not controlled with medication, fever greater than 101, severe or worsening abdominal pain, or other symptoms that concern you, please return immediately to the Emergency Department.

## 2017-10-04 NOTE — ED Provider Notes (Signed)
[facility] Emergency Department Provider Note  ____________________________________________   First MD Initiated Contact with Patient 10/04/17 1643     (approximate)  I have reviewed the triage vital signs and the nursing notes.   HISTORY  Chief Complaint Emesis    HPI Summer Buckley is a 32 y.o. female with extensive chronic medical and psychiatric history as listed below who presents for evaluation of episodic but persistent nausea, vomiting, occasional diarrhea, and abdominal pain over about the last 5 months.  She states that she has not been to her primary care doctor because she does not like them and does not like going to the doctor.  Her symptoms been worse over the last 5 days with persistent nausea and vomiting and inability to tolerate p.o. intake.  She describes the symptoms as severe.  They are also accompanied with frequent cramping lower abdominal pains that feel to her like contractions.  Nothing in particular makes it better or worse.  She denies fever/chills, chest pain, shortness of breath, and dysuria.  She has no vaginal/GU complaints nor concerns.   Past Medical History:  Diagnosis Date  . ADHD (attention deficit hyperactivity disorder)   . Anxiety   . Arthritis   . Bruises easily   . Bulging lumbar disc   . Chronic back pain   . Depression   . Endometriosis   . Fibromyalgia   . GERD (gastroesophageal reflux disease)    OCC-NO MEDS  . Headache   . 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 PTSD (post-traumatic stress disorder)   . Throat injury    PT STATES THAT SHE WAS STRANGLED YEARS AGO AND NOW HAS PROBLEMS WITH HER THROAT SWELLING-NORMALLY HAPPENS ONCE YEARLY -LAST HAPPENED IN JULY 2017    Patient Active Problem List   Diagnosis Date Noted  . Shortness of breath 02/07/2016    Past Surgical History:  Procedure Laterality Date  . ABDOMINAL HYSTERECTOMY    .  APPENDECTOMY    . BACK SURGERY    . EXPLORATORY LAPAROTOMY    . SHOULDER ARTHROSCOPY WITH BICEPS TENDON REPAIR Right 05/29/2016   Procedure: SHOULDER ARTHROSCOPY WITH MINI OPEN BICEPS TENDON REPAIR;  Surgeon: Corky Mull, MD;  Location: [facility] ORS;  Service: Orthopedics;  Laterality: Right;  . SHOULDER ARTHROSCOPY WITH SUBACROMIAL DECOMPRESSION Right 05/29/2016   Procedure: SHOULDER ARTHROSCOPY WITH SUBACROMIAL DECOMPRESSION AND DEBRIDEMENT;  Surgeon: Corky Mull, MD;  Location: [facility] ORS;  Service: Orthopedics;  Laterality: Right;  . TONSILLECTOMY      Prior to Admission medications   Medication Sig Start Date End Date Taking? Authorizing Provider  albuterol (PROVENTIL HFA;VENTOLIN HFA) 108 (90 Base) MCG/ACT inhaler Inhale 2 puffs into the lungs every 6 (six) hours as needed for wheezing or shortness of breath. 02/08/16   Epifanio Lesches, MD  ALPRAZolam Duanne Moron) 1 MG tablet Take 1 mg by mouth 4 (four) times daily.     [provider]  alprazolam Duanne Moron) 2 MG tablet Take 2 mg by mouth 3 (three) times daily as needed for sleep.    [provider]  amphetamine-dextroamphetamine (ADDERALL) 30 MG tablet Take 15-30 mg by mouth 2 (two) times daily. 30 mg in the morning (scheduled) & 15 mg or 30 mg in the afternoon (up to patient discretion)    [provider]  buPROPion (WELLBUTRIN SR) 200 MG 12 hr tablet Take 200 mg by mouth every morning.    [provider]  dicyclomine (BENTYL) 10 MG capsule Take 1 capsule (10 mg total) by mouth 3 (three) times daily as needed for up to 14 days for spasms. or abdominal pain 10/04/17 10/18/17  Hinda Kehr, MD  EPINEPHrine 0.3 mg/0.3 mL IJ SOAJ injection Inject 0.3 mg into the muscle daily as needed for anaphylaxis. 02/13/16   [provider]  ibuprofen (ADVIL,MOTRIN) 600 MG tablet Take 1 tablet (600 mg total) by mouth every 8 (eight) hours as needed. 10/28/16   Sable Feil, PA-C  ondansetron (ZOFRAN ODT) 4 MG  disintegrating tablet Allow 1-2 tablets to dissolve in your mouth every 8 hours as needed for nausea/vomiting 10/04/17   Hinda Kehr, MD  prazosin (MINIPRESS) 2 MG capsule Take 2 mg by mouth at bedtime. 05/06/16   [provider]  QUEtiapine (SEROQUEL) 50 MG tablet Take 50 mg by mouth 2 (two) times daily as needed for anxiety or sleep. 02/21/16   [provider]  triamcinolone cream (KENALOG) 0.1 % Apply 1 application topically 2 (two) times daily as needed. For skin rashes. 03/11/16   [provider]    Allergies Tape  Family History  Problem Relation Age of Onset  . Hypertension Mother   . Hyperlipidemia Mother   . Hypertension Father   . Hyperlipidemia Father   . Diabetes Father     Social History Social History   Tobacco Use  . Smoking status: Never Smoker  . Smokeless tobacco: Never Used  Substance Use Topics  . Alcohol use: No  . Drug use: No    Review of Systems Constitutional: No fever/chills Eyes: No visual changes. ENT: No sore throat. Cardiovascular: Denies chest pain. Respiratory: Denies shortness of breath. Gastrointestinal: Episodic abdominal pain with nausea and vomiting over the last 5 months, worse for 5 days, all as described above. Genitourinary: Negative for dysuria. Musculoskeletal: Negative for neck pain.  Negative for back pain. Integumentary: Negative for rash. Neurological: Negative for headaches, focal weakness or numbness.   ____________________________________________   PHYSICAL EXAM:  VITAL SIGNS: ED Triage Vitals  Enc Vitals Group     BP 10/04/17 1529 (!) 117/93     Pulse Rate 10/04/17 1529 (!) 129     Resp 10/04/17 1529 20     Temp 10/04/17 1529 98.9 F (37.2 C)     Temp Source 10/04/17 1529 Oral     SpO2 10/04/17 1529 100 %     Weight 10/04/17 1526 72.6 kg (160 lb)     Height 10/04/17 1526 1.651 m (5\' 5" )     Head Circumference --      Peak Flow --      Pain Score 10/04/17 1526 9     Pain Loc --       Pain Edu? --      Excl. in [place]? --     Constitutional: Alert and oriented.  Appears pale and uncomfortable but not in acute distress Eyes: Conjunctivae are normal.  Head: Atraumatic. Nose: No congestion/rhinnorhea. Mouth/Throat: Mucous membranes are moist. Neck: No stridor.  No meningeal signs.   Cardiovascular: Normal rate, regular rhythm. Good peripheral circulation. Grossly normal heart sounds. Respiratory: Normal respiratory effort.  No retractions. Lungs CTAB. Gastrointestinal: Soft with severe diffuse tenderness to the slightest touch of the skin of her abdomen.  No distention, difficult to assess rebound due to the voluntary guarding and the severity of the reaction to light touch Musculoskeletal: No lower extremity tenderness nor edema. No gross deformities of extremities. Neurologic:  Normal  speech and language. No gross focal neurologic deficits are appreciated.  Skin:  Skin is warm, dry and intact. No rash noted. Psychiatric: Mood and affect seem depressed but generally appropriate under the circumstances  ____________________________________________   LABS (all labs ordered are listed, but only abnormal results are displayed)  Labs Reviewed  COMPREHENSIVE METABOLIC PANEL - Abnormal; Notable for the following components:      Result Value   Potassium 3.4 (*)    Glucose, Bld 111 (*)    All other components within normal limits  URINALYSIS, COMPLETE (UACMP) WITH MICROSCOPIC - Abnormal; Notable for the following components:   Color, Urine STRAW (*)    APPearance CLEAR (*)    Ketones, ur 5 (*)    All other components within normal limits  LIPASE, BLOOD  CBC   ____________________________________________  EKG  None - EKG not ordered by ED physician ____________________________________________  RADIOLOGY   ED MD interpretation:  Enteritis  Official radiology report(s): Ct Abdomen Pelvis W Contrast  Result Date: 10/04/2017 CLINICAL DATA:  Intermittent nausea,  vomiting and diarrhea since January EXAM: CT ABDOMEN AND PELVIS WITH CONTRAST TECHNIQUE: Multidetector CT imaging of the abdomen and pelvis was performed using the standard protocol following bolus administration of intravenous contrast. CONTRAST:  79mL ISOVUE-370 IOPAMIDOL (ISOVUE-370) INJECTION 76% COMPARISON:  09/12/2015 CT FINDINGS: Lower chest: Small hiatal hernia. Normal size heart without pericardial effusion. Clear lung bases. Hepatobiliary: No space-occupying mass of the liver. Mild hepatic steatosis with focal fatty sparing about the gallbladder fossa. Gallbladder is physiologically distended without calculus. Pancreas: Atrophic pancreas without ductal dilatation, mass or inflammation. Spleen: Normal size spleen without mass. Adrenals/Urinary Tract: Normal adrenal glands and kidneys without nephrolithiasis, enhancing mass nor obstructive uropathy. Tiny too small to characterize hypodensity in the lower pole the right kidney statistically consistent with a cyst is identified measuring approximately 4 mm. Physiologic distention of the urinary bladder without focal mural thickening or calculus. Stomach/Bowel: Contrast distended stomach with normal small bowel rotation. Proximal jejunal loops are mildly distended with contrast up to 3 cm. No mechanical bowel obstruction. Status post appendectomy. Liquid stool within the right colon compatible with diarrheal disease. No transmural thickening is identified. More well formed stool is seen in the rectosigmoid. Vascular/Lymphatic: No aneurysm or dissection.  No lymphadenopathy. Reproductive: Hysterectomy.  No adnexal mass. Other: No abdominal wall hernia or abnormality. No abdominopelvic ascites. Musculoskeletal: No acute or significant osseous findings. No sclerosis about the SI joints to suggest sacroiliitis. IMPRESSION: 1. Mild distention of proximal jejunum query enteritis. Liquid stool within the right colon compatible with diarrheal disease. 2. Tiny too small  to characterize hypodensity in the right lower pole the kidney compatible with a cyst measuring 4 mm. Electronically Signed   By: Ashley Royalty M.D.   On: 10/04/2017 19:21    ____________________________________________   PROCEDURES  Critical Care performed: No   Procedure(s) performed:   Procedures   ____________________________________________   INITIAL IMPRESSION / ASSESSMENT AND PLAN / ED COURSE  As part of my medical decision making, I reviewed the following data within the [place] notes reviewed and incorporated, Labs reviewed , Old chart reviewed and Notes from prior ED visits    Differential diagnosis includes, but is not limited to, IBS, IBD, cannabinoid hyperemesis syndrome, SBO/ileus, viral gastroenteritis, foodborne pathogen.  Fortunately the the patient's labs are quite reassuring; she has a very slight decrease in her potassium level but not as much as I would expect for 5 days of nausea and  vomiting and diarrhea as she describes (she told me she thinks that she has had more than 100 episodes of diarrhea in the last week).  She has no leukocytosis which is also reassuring and normal lipase.  She has not yet provided a urine sample.  She was tachycardic to almost 130 upon arrival but she was also quite anxious and upset.  She is calm down now and received 1 L of fluids and her heart rate is down around 100.  She is receiving a second liter of normal saline by IV.  She is received Zofran 4 mg by IV and she is complaining of intermittent abdominal pain as well as persistent nausea vomiting.  Given the persistent nature of the symptoms, as well as the cyclic nature, I will give her Haldol 2 mg IV, Benadryl 12.5 mg IV, both to help with the intractable nausea and vomiting.  Additionally for the cramping abdominal pain I will give her Bentyl 20 mg intramuscular.  I think that this will be helpful because I think by far the most likely diagnosis is IBS.   Given how tender to palpation she is, and the fact she has not had any CTs of her abdomen and pelvis in the computer system, I will obtain a CT scan, but I think it is unlikely I will find an acute or emergent condition today.  She agrees with the plan at this time.  I did counsel her and her mother extensively about how important that she follow-up with her primary care doctor as well as to establish care with a GI doctor, process with which I can help.  They understand and agree with the plan.  Clinical Course as of Oct 05 1947  Sun Oct 04, 2017  1916 Generally reassuring UA, just a few ketones, but not consistent with dehydration nor infection  Urinalysis, Complete w Microscopic(!) [CF]  1947 The patient is resting comfortably.  Her heart rate is down in the 80s.  She has not had any additional emesis or diarrhea.  Her CT scan shows some enteritis but no evidence of acute or emergent condition.  I discussed with her how important it is that she follow-up with gastroenterology.  There is no role for antibiotics at this time, particularly given the intermittent but persistent/episodic nature of her symptoms.  I am prescribing Zofran and Bentyl and encourage close follow-up either with her PCP, Dr. Vicente Males or 1 of his colleagues, or both.  I gave my usual and customary return precautions.  She understands and agrees with the plan.   [CF]    Clinical Course User Index [CF] Hinda Kehr, MD    ____________________________________________  FINAL CLINICAL IMPRESSION(S) / ED DIAGNOSES  Final diagnoses:  Nausea vomiting and diarrhea  Generalized abdominal pain     MEDICATIONS GIVEN DURING THIS VISIT:  Medications  ondansetron (ZOFRAN) injection 4 mg (4 mg Intravenous Given 10/04/17 1541)  sodium chloride 0.9 % bolus 1,000 mL (0 mLs Intravenous Stopped 10/04/17 1633)  0.9 %  sodium chloride infusion ( Intravenous Stopped 10/04/17 1719)  haloperidol lactate (HALDOL) injection 2 mg (2 mg Intravenous  Given 10/04/17 1729)  diphenhydrAMINE (BENADRYL) injection 12.5 mg (12.5 mg Intravenous Given 10/04/17 1728)  dicyclomine (BENTYL) injection 20 mg (20 mg Intramuscular Given 10/04/17 1730)  iopamidol (ISOVUE-300) 61 % injection 30 mL (30 mLs Oral Contrast Given 10/04/17 1724)  0.9 %  sodium chloride infusion ( Intravenous Stopped 10/04/17 1839)  iopamidol (ISOVUE-370) 76 % injection 75 mL (75 mLs  Intravenous Contrast Given 10/04/17 1842)     ED Discharge Orders        Ordered    ondansetron (ZOFRAN ODT) 4 MG disintegrating tablet     10/04/17 1932    dicyclomine (BENTYL) 10 MG capsule  3 times daily PRN     10/04/17 1932       Note:  This document was prepared using Dragon voice recognition software and may include unintentional dictation errors.    Hinda Kehr, MD 10/04/17 1949

## 2018-04-17 ENCOUNTER — Encounter: Payer: Self-pay | Admitting: Emergency Medicine

## 2018-04-17 ENCOUNTER — Other Ambulatory Visit: Payer: Self-pay

## 2018-04-17 ENCOUNTER — Emergency Department
Admission: EM | Admit: 2018-04-17 | Discharge: 2018-04-17 | Disposition: A | Discharge status: Home / Self Care | Payer: Medicaid Other | Attending: Emergency Medicine | Admitting: Emergency Medicine

## 2018-04-17 ENCOUNTER — Emergency Department: Payer: Medicaid Other

## 2018-04-17 DIAGNOSIS — R079 Chest pain, unspecified: Secondary | ICD-10-CM | POA: Diagnosis present

## 2018-04-17 LAB — MAGNESIUM: Magnesium: 2 mg/dL (ref 1.7–2.4)

## 2018-04-17 LAB — BASIC METABOLIC PANEL
ANION GAP: 9 (ref 5–15)
BUN: 12 mg/dL (ref 6–20)
CALCIUM: 9.2 mg/dL (ref 8.9–10.3)
CO2: 26 mmol/L (ref 22–32)
CREATININE: 0.6 mg/dL (ref 0.44–1.00)
Chloride: 104 mmol/L (ref 98–111)
GFR calc Af Amer: >60 mL/min (ref >60)
GLUCOSE: 138 mg/dL — AB (ref 70–99)
Potassium: 3.1 mmol/L — ABNORMAL LOW (ref 3.5–5.1)
Sodium: 139 mmol/L (ref 135–145)

## 2018-04-17 LAB — CBC
HCT: 40.9 % (ref 36.0–46.0)
Hemoglobin: 13.7 g/dL (ref 12.0–15.0)
MCH: 28.8 pg (ref 26.0–34.0)
MCHC: 33.5 g/dL (ref 30.0–36.0)
MCV: 85.9 fL (ref 80.0–100.0)
PLATELETS: 356 10*3/uL (ref 150–400)
RBC: 4.76 MIL/uL (ref 3.87–5.11)
RDW: 13.3 % (ref 11.5–15.5)
WBC: 5.1 10*3/uL (ref 4.0–10.5)
nRBC: 0 % (ref 0.0–0.2)

## 2018-04-17 LAB — TROPONIN I: Troponin I: <0.03 ng/mL (ref <0.03)

## 2018-04-17 MED ORDER — KETOROLAC TROMETHAMINE 30 MG/ML IJ SOLN
30.0000 mg | Freq: Once | INTRAMUSCULAR | Status: AC
  Administered 2018-04-17: 30 mg via INTRAVENOUS
  Filled 2018-04-17: qty 1

## 2018-04-17 MED ORDER — LORAZEPAM 2 MG/ML IJ SOLN
1.0000 mg | Freq: Once | INTRAMUSCULAR | Status: AC
  Administered 2018-04-17: 1 mg via INTRAVENOUS
  Filled 2018-04-17: qty 1

## 2018-04-17 MED ORDER — DIAZEPAM 5 MG PO TABS
10.0000 mg | ORAL_TABLET | Freq: Once | ORAL | Status: AC
  Administered 2018-04-17: 10 mg via ORAL
  Filled 2018-04-17: qty 2

## 2018-04-17 NOTE — ED Provider Notes (Signed)
[facility] Emergency Department Provider Note       Time seen: ----------------------------------------- 7:36 PM on 04/17/2018 -----------------------------------------   I have reviewed the triage vital signs and the nursing notes.  HISTORY   Chief Complaint Chest Pain    HPI Summer Buckley is a 32 y.o. female with a history of ADHD, anxiety, chronic back pain, depression, fibromyalgia, headache, PTSD who presents to the ED for chest pain since yesterday.  Patient appears very anxious on arrival here.  Patient reports pain was intermittent yesterday and constant today.  She is complaining of shortness of breath.  Past Medical History:  Diagnosis Date  . ADHD (attention deficit hyperactivity disorder)   . Anxiety   . Arthritis   . Bruises easily   . Bulging lumbar disc   . Chronic back pain   . Depression   . Endometriosis   . Fibromyalgia   . GERD (gastroesophageal reflux disease)    OCC-NO MEDS  . Headache   . 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 PTSD (post-traumatic stress disorder)   . Throat injury    PT STATES THAT SHE WAS STRANGLED YEARS AGO AND NOW HAS PROBLEMS WITH HER THROAT SWELLING-NORMALLY HAPPENS ONCE YEARLY -LAST HAPPENED IN JULY 2017    Patient Active Problem List   Diagnosis Date Noted  . Shortness of breath 02/07/2016    Past Surgical History:  Procedure Laterality Date  . ABDOMINAL HYSTERECTOMY    . APPENDECTOMY    . BACK SURGERY    . EXPLORATORY LAPAROTOMY    . SHOULDER ARTHROSCOPY WITH BICEPS TENDON REPAIR Right 05/29/2016   Procedure: SHOULDER ARTHROSCOPY WITH MINI OPEN BICEPS TENDON REPAIR;  Surgeon: Corky Mull, MD;  Location: [facility] ORS;  Service: Orthopedics;  Laterality: Right;  . SHOULDER ARTHROSCOPY WITH SUBACROMIAL DECOMPRESSION Right 05/29/2016   Procedure: SHOULDER ARTHROSCOPY WITH SUBACROMIAL DECOMPRESSION AND DEBRIDEMENT;  Surgeon: Corky Mull, MD;  Location: [facility] ORS;  Service: Orthopedics;  Laterality: Right;  . TONSILLECTOMY      Allergies Tape  Social History Social History   Tobacco Use  . Smoking status: Never Smoker  . Smokeless tobacco: Never Used  Substance Use Topics  . Alcohol use: No  . Drug use: No   Review of Systems Constitutional: Negative for fever. Cardiovascular: Positive for chest pain Respiratory: Negative for shortness of breath. Gastrointestinal: Negative for abdominal pain, vomiting and diarrhea. Musculoskeletal: Negative for back pain. Skin: Negative for rash. Neurological: Negative for headaches, focal weakness or numbness.  All systems negative/normal/unremarkable except as stated in the HPI  ____________________________________________   PHYSICAL EXAM:  VITAL SIGNS: ED Triage Vitals  Enc Vitals Group     BP 04/17/18 1827 137/78     Pulse Rate 04/17/18 1827 (!) 125     Resp 04/17/18 1827 (!) 26     Temp 04/17/18 1826 98.3 F (36.8 C)     Temp Source 04/17/18 1826 Oral     SpO2 04/17/18 1827 100 %     Weight 04/17/18 1822 142 lb (64.4 kg)     Height 04/17/18 1822 5\' 5"  (1.651 m)     Head Circumference --      Peak Flow --      Pain Score 04/17/18 1822 10     Pain Loc --      Pain Edu? --      Excl. in [place]? --     Constitutional: Alert  and oriented.  Anxious, mild distress Eyes: Conjunctivae are normal. Normal extraocular movements. ENT   Head: Normocephalic and atraumatic.   Nose: No congestion/rhinnorhea.   Mouth/Throat: Mucous membranes are moist.   Neck: No stridor. Cardiovascular: Normal rate, regular rhythm. No murmurs, rubs, or gallops. Respiratory: Normal respiratory effort without tachypnea nor retractions. Breath sounds are clear and equal bilaterally. No wheezes/rales/rhonchi. Gastrointestinal: Soft and nontender. Normal bowel sounds Musculoskeletal: Nontender with normal range of motion in extremities. No lower extremity tenderness nor  edema. Neurologic:  Normal speech and language. No gross focal neurologic deficits are appreciated.  Skin:  Skin is warm, dry and intact. No rash noted. Psychiatric: Mood and affect are normal. Speech and behavior are normal.  ____________________________________________  EKG: Interpreted by me.  Sinus tachycardia with a rate of 121 bpm, incomplete right bundle branch block, market ST segment abnormalities which appear chronic, long QT  Repeat EKG interpreted by me, sinus rhythm rate of 92 bpm, incomplete right bundle branch block pattern, normal axis, normal QT.  Improved ST segment abnormalities  ____________________________________________  ED COURSE:  As part of my medical decision making, I reviewed the following data within the [place] History obtained from family if available, nursing notes, old chart and ekg, as well as notes from prior ED visits. Patient presented for chest pain, we will assess with labs and imaging as indicated at this time.   Procedures ____________________________________________   LABS (pertinent positives/negatives)  Labs Reviewed  BASIC METABOLIC PANEL - Abnormal; Notable for the following components:      Result Value   Potassium 3.1 (*)    Glucose, Bld 138 (*)    All other components within normal limits  CBC  TROPONIN I  MAGNESIUM  TROPONIN I    RADIOLOGY Images were viewed by me  Chest x-ray is unremarkable  ____________________________________________  DIFFERENTIAL DIAGNOSIS   Musculoskeletal pain, GERD, anxiety, panic attack, unstable angina, PE  FINAL ASSESSMENT AND PLAN  Chest pain   Plan: The patient had presented for chest pain which is likely anxiety in origin. Patient's labs are unremarkable including repeat troponin. Patient's imaging also unremarkable.  Her EKG is chronically abnormal but she is low risk for ACS.   Laurence Aly, MD   Note: This note was generated in part or whole with  voice recognition software. Voice recognition is usually quite accurate but there are transcription errors that can and very often do occur. I apologize for any typographical errors that were not detected and corrected.     Earleen Newport, MD 04/17/18 [phone]

## 2018-04-17 NOTE — ED Notes (Signed)
Reviewed discharge instructions, follow-up care with patient. Patient verbalized understanding of all information reviewed. Patient stable, with no distress noted at this time.    

## 2018-04-17 NOTE — ED Triage Notes (Addendum)
Here for central chest pain since yesterday. Pt appears very anxious.  Pain was intermittent yesterday and constant today. C/o SHOB.  Unlabored currently.  Pt states "my doctor told me I am high risk because of everything going on with me".  Verified all medical history with patient.  Color WNL.  Tachy in triage with long QTC. Is supposed to take estrogen r/t hysterectomy but does not consistently take it.  Does not smoke. Recent travel to [place] last weekend.  Worse with inspiration.

## 2018-04-17 NOTE — ED Notes (Signed)
Patient is tearful, states she is having pain in other parts of her body due to anxiety. Patient states chest pain is slightly improved.

## 2018-04-17 NOTE — ED Notes (Signed)
Patient is sitting upright on the stretcher on her cell phone. Patient is calm, moves easily on stretcher. NAD.

## 2018-04-22 ENCOUNTER — Emergency Department
Admission: EM | Admit: 2018-04-22 | Discharge: 2018-04-22 | Disposition: A | Discharge status: Home / Self Care | Payer: Medicaid Other | Attending: Emergency Medicine | Admitting: Emergency Medicine

## 2018-04-22 ENCOUNTER — Emergency Department: Payer: Medicaid Other

## 2018-04-22 ENCOUNTER — Other Ambulatory Visit: Payer: Self-pay

## 2018-04-22 DIAGNOSIS — R071 Chest pain on breathing: Secondary | ICD-10-CM | POA: Diagnosis not present

## 2018-04-22 DIAGNOSIS — F909 Attention-deficit hyperactivity disorder, unspecified type: Secondary | ICD-10-CM | POA: Diagnosis not present

## 2018-04-22 DIAGNOSIS — Z79899 Other long term (current) drug therapy: Secondary | ICD-10-CM | POA: Diagnosis not present

## 2018-04-22 DIAGNOSIS — R079 Chest pain, unspecified: Secondary | ICD-10-CM | POA: Diagnosis present

## 2018-04-22 LAB — BASIC METABOLIC PANEL
ANION GAP: 9 (ref 5–15)
BUN: 11 mg/dL (ref 6–20)
CO2: 29 mmol/L (ref 22–32)
Calcium: 8.9 mg/dL (ref 8.9–10.3)
Chloride: 102 mmol/L (ref 98–111)
Creatinine, Ser: 0.52 mg/dL (ref 0.44–1.00)
GFR calc Af Amer: >60 mL/min (ref >60)
GLUCOSE: 109 mg/dL — AB (ref 70–99)
POTASSIUM: 2.8 mmol/L — AB (ref 3.5–5.1)
Sodium: 140 mmol/L (ref 135–145)

## 2018-04-22 LAB — CBC
HCT: 37.8 % (ref 36.0–46.0)
HEMOGLOBIN: 12.4 g/dL (ref 12.0–15.0)
MCH: 28.6 pg (ref 26.0–34.0)
MCHC: 32.8 g/dL (ref 30.0–36.0)
MCV: 87.3 fL (ref 80.0–100.0)
Platelets: 299 10*3/uL (ref 150–400)
RBC: 4.33 MIL/uL (ref 3.87–5.11)
RDW: 13.2 % (ref 11.5–15.5)
WBC: 6.6 10*3/uL (ref 4.0–10.5)
nRBC: 0 % (ref 0.0–0.2)

## 2018-04-22 LAB — FIBRIN DERIVATIVES D-DIMER (ARMC ONLY): Fibrin derivatives D-dimer (ARMC): 1065.72 ng/mL (FEU) — ABNORMAL HIGH (ref 0.00–499.00)

## 2018-04-22 LAB — TROPONIN I

## 2018-04-22 MED ORDER — KETOROLAC TROMETHAMINE 30 MG/ML IJ SOLN
30.0000 mg | Freq: Once | INTRAMUSCULAR | Status: AC
  Administered 2018-04-22: 30 mg via INTRAVENOUS
  Filled 2018-04-22: qty 1

## 2018-04-22 MED ORDER — HALOPERIDOL LACTATE 5 MG/ML IJ SOLN
INTRAMUSCULAR | Status: AC
  Administered 2018-04-22: 5 mg via INTRAVENOUS
  Filled 2018-04-22: qty 1

## 2018-04-22 MED ORDER — IOHEXOL 350 MG/ML SOLN
75.0000 mL | Freq: Once | INTRAVENOUS | Status: AC | PRN
  Administered 2018-04-22: 75 mL via INTRAVENOUS

## 2018-04-22 MED ORDER — POTASSIUM CHLORIDE CRYS ER 20 MEQ PO TBCR
40.0000 meq | EXTENDED_RELEASE_TABLET | Freq: Once | ORAL | Status: AC
  Administered 2018-04-22: 40 meq via ORAL
  Filled 2018-04-22: qty 2

## 2018-04-22 MED ORDER — HALOPERIDOL LACTATE 5 MG/ML IJ SOLN
5.0000 mg | Freq: Once | INTRAMUSCULAR | Status: AC
  Administered 2018-04-22: 5 mg via INTRAVENOUS

## 2018-04-22 MED ORDER — LIDOCAINE VISCOUS HCL 2 % MT SOLN
15.0000 mL | Freq: Once | OROMUCOSAL | Status: AC
  Administered 2018-04-22: 15 mL via ORAL
  Filled 2018-04-22: qty 15

## 2018-04-22 MED ORDER — ALUM & MAG HYDROXIDE-SIMETH 200-200-20 MG/5ML PO SUSP
30.0000 mL | Freq: Once | ORAL | Status: AC
  Administered 2018-04-22: 30 mL via ORAL
  Filled 2018-04-22: qty 30

## 2018-04-22 NOTE — ED Notes (Signed)
Patient transported to CT 

## 2018-04-22 NOTE — ED Provider Notes (Signed)
[facility] Emergency Department Provider Note  ____________________________________________   I have reviewed the triage vital signs and the nursing notes.   HISTORY  Chief Complaint Chest Pain   History limited by: Not Limited   HPI Summer Buckley is a 32 y.o. female who presents to the emergency department today because of concerns for continued chest pain.  Patient states that it is a feeling of tightness.  Located throughout her chest.  Is somewhat worse with deep breaths.  Has had associated shortness of breath.  She denies similar symptoms in the past.  She does state that she recently drove to and from [place].  Denies any fevers although has had some sweating.   Per medical record review patient has a history of chronic back pain, recent ER visit with negative CXR, troponin.   Past Medical History:  Diagnosis Date  . ADHD (attention deficit hyperactivity disorder)   . Anxiety   . Arthritis   . Bruises easily   . Bulging lumbar disc   . Chronic back pain   . Depression   . Endometriosis   . Fibromyalgia   . GERD (gastroesophageal reflux disease)    OCC-NO MEDS  . Headache   . 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 PTSD (post-traumatic stress disorder)   . Throat injury    PT STATES THAT SHE WAS STRANGLED YEARS AGO AND NOW HAS PROBLEMS WITH HER THROAT SWELLING-NORMALLY HAPPENS ONCE YEARLY -LAST HAPPENED IN JULY 2017    Patient Active Problem List   Diagnosis Date Noted  . Shortness of breath 02/07/2016    Past Surgical History:  Procedure Laterality Date  . ABDOMINAL HYSTERECTOMY    . APPENDECTOMY    . BACK SURGERY    . EXPLORATORY LAPAROTOMY    . SHOULDER ARTHROSCOPY WITH BICEPS TENDON REPAIR Right 05/29/2016   Procedure: SHOULDER ARTHROSCOPY WITH MINI OPEN BICEPS TENDON REPAIR;  Surgeon: Corky Mull, MD;  Location: [facility] ORS;  Service: Orthopedics;  Laterality:  Right;  . SHOULDER ARTHROSCOPY WITH SUBACROMIAL DECOMPRESSION Right 05/29/2016   Procedure: SHOULDER ARTHROSCOPY WITH SUBACROMIAL DECOMPRESSION AND DEBRIDEMENT;  Surgeon: Corky Mull, MD;  Location: [facility] ORS;  Service: Orthopedics;  Laterality: Right;  . TONSILLECTOMY      Prior to Admission medications   Medication Sig Start Date End Date Taking? Authorizing Provider  albuterol (PROVENTIL HFA;VENTOLIN HFA) 108 (90 Base) MCG/ACT inhaler Inhale 2 puffs into the lungs every 6 (six) hours as needed for wheezing or shortness of breath. 02/08/16   Epifanio Lesches, MD  ALPRAZolam Duanne Moron) 1 MG tablet Take 1 mg by mouth 4 (four) times daily.     [provider]  alprazolam Duanne Moron) 2 MG tablet Take 2 mg by mouth 3 (three) times daily as needed for sleep.    [provider]  amphetamine-dextroamphetamine (ADDERALL) 30 MG tablet Take 15-30 mg by mouth 2 (two) times daily. 30 mg in the morning (scheduled) & 15 mg or 30 mg in the afternoon (up to patient discretion)    [provider]  buPROPion (WELLBUTRIN SR) 200 MG 12 hr tablet Take 200 mg by mouth every morning.    [provider]  dicyclomine (BENTYL) 10 MG capsule Take 1 capsule (10 mg total) by mouth 3 (three) times daily as needed for up to 14 days for spasms. or abdominal pain 10/04/17 10/18/17  Hinda Kehr, MD  EPINEPHrine 0.3 mg/0.3 mL IJ SOAJ  injection Inject 0.3 mg into the muscle daily as needed for anaphylaxis. 02/13/16   [provider]  ibuprofen (ADVIL,MOTRIN) 600 MG tablet Take 1 tablet (600 mg total) by mouth every 8 (eight) hours as needed. 10/28/16   Sable Feil, PA-C  ondansetron (ZOFRAN ODT) 4 MG disintegrating tablet Allow 1-2 tablets to dissolve in your mouth every 8 hours as needed for nausea/vomiting 10/04/17   Hinda Kehr, MD  prazosin (MINIPRESS) 2 MG capsule Take 2 mg by mouth at bedtime. 05/06/16   [provider]  QUEtiapine (SEROQUEL) 50 MG tablet Take 50 mg by mouth 2  (two) times daily as needed for anxiety or sleep. 02/21/16   [provider]  triamcinolone cream (KENALOG) 0.1 % Apply 1 application topically 2 (two) times daily as needed. For skin rashes. 03/11/16   [provider]    Allergies Tape  Family History  Problem Relation Age of Onset  . Hypertension Mother   . Hyperlipidemia Mother   . Hypertension Father   . Hyperlipidemia Father   . Diabetes Father     Social History Social History   Tobacco Use  . Smoking status: Never Smoker  . Smokeless tobacco: Never Used  Substance Use Topics  . Alcohol use: No  . Drug use: No    Review of Systems Constitutional: No fever/chills Eyes: No visual changes. ENT: No sore throat. Cardiovascular: Positive for chest pain. Respiratory: Positive for shortness of breath. Gastrointestinal: No abdominal pain.  No nausea, no vomiting.  No diarrhea.   Genitourinary: Negative for dysuria. Musculoskeletal: Negative for back pain. Skin: Negative for rash. Neurological: Negative for headaches, focal weakness or numbness.  ____________________________________________   PHYSICAL EXAM:  VITAL SIGNS: ED Triage Vitals  Enc Vitals Group     BP 04/22/18 1142 (!) 164/132     Pulse Rate 04/22/18 1142 80     Resp 04/22/18 1142 18     Temp 04/22/18 1142 97.6 F (36.4 C)     Temp Source 04/22/18 1142 Oral     SpO2 04/22/18 1142 98 %     Weight 04/22/18 1143 141 lb 3.2 oz (64 kg)     Height 04/22/18 1143 5\' 5"  (1.651 m)     Head Circumference --      Peak Flow --      Pain Score 04/22/18 1143 7   Constitutional: Alert and oriented.  Eyes: Conjunctivae are normal.  ENT      Head: Normocephalic and atraumatic.      Nose: No congestion/rhinnorhea.      Mouth/Throat: Mucous membranes are moist.      Neck: No stridor. Hematological/Lymphatic/Immunilogical: No cervical lymphadenopathy. Cardiovascular: Normal rate, regular rhythm.  No murmurs, rubs, or gallops. Respiratory: Normal  respiratory effort without tachypnea nor retractions. Breath sounds are clear and equal bilaterally. No wheezes/rales/rhonchi. Gastrointestinal: Soft and non tender. No rebound. No guarding.  Genitourinary: Deferred Musculoskeletal: Normal range of motion in all extremities. No lower extremity edema. Neurologic:  Normal speech and language. No gross focal neurologic deficits are appreciated.  Skin:  Skin is warm, dry and intact. No rash noted. Psychiatric: Mood and affect are normal. Speech and behavior are normal. Patient exhibits appropriate insight and judgment.  ____________________________________________    LABS (pertinent positives/negatives)  D-dimer 1065 CBC wbc 6.6, hgb 12.4, plt 299 BMP na 140, k 2.8, glu 109, cr 0.52 Trop <0.03 ____________________________________________   EKG  I, Nance Pear, attending physician, personally viewed and interpreted this EKG  EKG Time:  1137 Rate: 94 Rhythm: sinus rhythm Axis: normal Intervals: qtc 476 QRS: narrow ST changes: no st elevation, t wave inversion II, III, aVF, v1-v5 Impression: abnormal ekg   ____________________________________________    RADIOLOGY  CT angio No acute findings  ____________________________________________   PROCEDURES  Procedures  ____________________________________________   INITIAL IMPRESSION / ASSESSMENT AND PLAN / ED COURSE  Pertinent labs & imaging results that were available during my care of the patient were reviewed by me and considered in my medical decision making (see chart for details).   Patient returned to the emergency department today because of concerns for continued chest pain.  D-dimer was checked today which was elevated to 1000.  This prompted a CT angios which did not show any concerning findings.  Discussed this finding with the patient.  At this point unclear etiology the patient's pain.  She did feel better after some IV Haldol.  Discussed with patient  importance of primary care follow-up.   ____________________________________________   FINAL CLINICAL IMPRESSION(S) / ED DIAGNOSES  Final diagnoses:  Nonspecific chest pain     Note: This dictation was prepared with Dragon dictation. Any transcriptional errors that result from this process are unintentional     Nance Pear, MD 04/22/18 1609

## 2018-04-22 NOTE — Discharge Instructions (Addendum)
Please seek medical attention for any high fevers, chest pain, shortness of breath, change in behavior, persistent vomiting, bloody stool or any other new or concerning symptoms.  

## 2018-04-22 NOTE — ED Triage Notes (Addendum)
Pt arrived via EMS from [facility] c/o chest pain that woke her up from her sleep this morning around 0500. Pt states that she was diaphoretic. Pt received 324mg  of aspirin and nitro PTA.

## 2018-05-29 ENCOUNTER — Ambulatory Visit
Admission: EM | Admit: 2018-05-29 | Discharge: 2018-05-29 | Discharge status: Against Medical Advice | Payer: Medicaid Other

## 2018-10-14 ENCOUNTER — Telehealth: Payer: Self-pay

## 2018-10-14 NOTE — Telephone Encounter (Signed)
Unable to leave message; voice mailbox not set up. 

## 2018-10-14 NOTE — Telephone Encounter (Signed)
Pt called triage line stating it has been a long time since she has been seen here. We delivered her twins, who are now 34yo. She states her C/S scar is swollen and painful. We are the only ones she knows to call about this.   Pt. Concern is not an emergency. Message sent to Clarise Cruz to schedule appointment for annual d/t It appears pt needs an annual exam, if there are any other problems or concerns she may come into office with any provider sooner than guidelines of scheduling annuals d/t COVID. CB# [phone]

## 2018-12-25 ENCOUNTER — Ambulatory Visit
Admission: EM | Admit: 2018-12-25 | Discharge: 2018-12-25 | Disposition: A | Discharge status: Nursing Home (Includes ICF) | Payer: Medicaid Other | Attending: Family Medicine | Admitting: Family Medicine

## 2018-12-25 ENCOUNTER — Other Ambulatory Visit: Payer: Self-pay

## 2018-12-25 ENCOUNTER — Encounter: Payer: Self-pay | Admitting: Emergency Medicine

## 2018-12-25 ENCOUNTER — Emergency Department: Payer: Medicaid Other

## 2018-12-25 ENCOUNTER — Emergency Department
Admission: EM | Admit: 2018-12-25 | Discharge: 2018-12-25 | Disposition: A | Discharge status: Home / Self Care | Payer: Medicaid Other | Attending: Emergency Medicine | Admitting: Emergency Medicine

## 2018-12-25 DIAGNOSIS — W540XXD Bitten by dog, subsequent encounter: Secondary | ICD-10-CM | POA: Diagnosis not present

## 2018-12-25 DIAGNOSIS — Z23 Encounter for immunization: Secondary | ICD-10-CM | POA: Insufficient documentation

## 2018-12-25 DIAGNOSIS — S71131A Puncture wound without foreign body, right thigh, initial encounter: Secondary | ICD-10-CM | POA: Diagnosis not present

## 2018-12-25 DIAGNOSIS — Z203 Contact with and (suspected) exposure to rabies: Secondary | ICD-10-CM | POA: Diagnosis not present

## 2018-12-25 DIAGNOSIS — W540XXA Bitten by dog, initial encounter: Secondary | ICD-10-CM

## 2018-12-25 DIAGNOSIS — Z2914 Encounter for prophylactic rabies immune globin: Secondary | ICD-10-CM | POA: Insufficient documentation

## 2018-12-25 MED ORDER — RABIES IMMUNE GLOBULIN 150 UNIT/ML IM INJ
20.0000 [IU]/kg | INJECTION | Freq: Once | INTRAMUSCULAR | Status: AC
  Administered 2018-12-25: 1200 [IU] via INTRAMUSCULAR
  Filled 2018-12-25: qty 8

## 2018-12-25 MED ORDER — AMOXICILLIN-POT CLAVULANATE 875-125 MG PO TABS
1.0000 | ORAL_TABLET | Freq: Once | ORAL | Status: AC
  Administered 2018-12-25: 1 via ORAL
  Filled 2018-12-25: qty 1

## 2018-12-25 MED ORDER — AMOXICILLIN-POT CLAVULANATE 875-125 MG PO TABS: 1.0000 | ORAL_TABLET | Freq: Two times a day (BID) | ORAL | 0 refills | Status: DC

## 2018-12-25 MED ORDER — MUPIROCIN CALCIUM 2 % EX CREA: TOPICAL_CREAM | CUTANEOUS | 0 refills | Status: AC

## 2018-12-25 MED ORDER — RABIES VACCINE, PCEC IM SUSR
1.0000 mL | Freq: Once | INTRAMUSCULAR | Status: AC
  Administered 2018-12-25: 1 mL via INTRAMUSCULAR
  Filled 2018-12-25: qty 1

## 2018-12-25 MED ORDER — NAPROXEN 500 MG PO TABS
500.0000 mg | ORAL_TABLET | Freq: Once | ORAL | Status: AC
  Administered 2018-12-25: 500 mg via ORAL
  Filled 2018-12-25: qty 1

## 2018-12-25 NOTE — ED Triage Notes (Signed)
Pt was at home outside on Wednesday night when a stray dog (pitbull) attacked her and bit her right upper thigh (in [place]). Has pain that goes down right side. No fever, but slight chills since yesterday.

## 2018-12-25 NOTE — ED Provider Notes (Signed)
[facility] Emergency Department Provider Note  ____________________________________________  Time seen: Approximately 12:42 PM  I have reviewed the triage vital signs and the nursing notes.   HISTORY  Chief Complaint Animal Bite   HPI DIOSELINA BRUMBAUGH is a 33 y.o. female who presents to the emergency department for treatment and evaluation of a dog bite to the right upper thigh. Injury occurred 3 days ago. She went to [facility] and was sent here for rabies injections and further treatment. Patient states that she reported the bite, but the dog is a stray and animal control has not been able to find it.  Past Medical History:  Diagnosis Date  . ADHD (attention deficit hyperactivity disorder)   . Anxiety   . Arthritis   . Bruises easily   . Bulging lumbar disc   . Chronic back pain   . Depression   . Endometriosis   . Fibromyalgia   . GERD (gastroesophageal reflux disease)    OCC-NO MEDS  . Headache   . 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 PTSD (post-traumatic stress disorder)   . Throat injury    PT STATES THAT SHE WAS STRANGLED YEARS AGO AND NOW HAS PROBLEMS WITH HER THROAT SWELLING-NORMALLY HAPPENS ONCE YEARLY -LAST HAPPENED IN JULY 2017    Patient Active Problem List   Diagnosis Date Noted  . Shortness of breath 02/07/2016    Past Surgical History:  Procedure Laterality Date  . ABDOMINAL HYSTERECTOMY    . APPENDECTOMY    . BACK SURGERY    . EXPLORATORY LAPAROTOMY    . SHOULDER ARTHROSCOPY WITH BICEPS TENDON REPAIR Right 05/29/2016   Procedure: SHOULDER ARTHROSCOPY WITH MINI OPEN BICEPS TENDON REPAIR;  Surgeon: Corky Mull, MD;  Location: [facility] ORS;  Service: Orthopedics;  Laterality: Right;  . SHOULDER ARTHROSCOPY WITH SUBACROMIAL DECOMPRESSION Right 05/29/2016   Procedure: SHOULDER ARTHROSCOPY WITH SUBACROMIAL DECOMPRESSION AND DEBRIDEMENT;  Surgeon: Corky Mull,  MD;  Location: [facility] ORS;  Service: Orthopedics;  Laterality: Right;  . TONSILLECTOMY      Prior to Admission medications   Medication Sig Start Date End Date Taking? Authorizing Provider  albuterol (PROVENTIL HFA;VENTOLIN HFA) 108 (90 Base) MCG/ACT inhaler Inhale 2 puffs into the lungs every 6 (six) hours as needed for wheezing or shortness of breath. 02/08/16   Epifanio Lesches, MD  ALPRAZolam Duanne Moron) 1 MG tablet Take 1 mg by mouth 4 (four) times daily.     [provider]  alprazolam Duanne Moron) 2 MG tablet Take 2 mg by mouth 3 (three) times daily as needed for sleep.    [provider]  amoxicillin-clavulanate (AUGMENTIN) 875-125 MG tablet Take 1 tablet by mouth 2 (two) times daily. 12/25/18   Magali Bray B, FNP  amphetamine-dextroamphetamine (ADDERALL) 30 MG tablet Take 15-30 mg by mouth 2 (two) times daily. 30 mg in the morning (scheduled) & 15 mg or 30 mg in the afternoon (up to patient discretion)    [provider]  buPROPion (WELLBUTRIN SR) 200 MG 12 hr tablet Take 200 mg by mouth every morning.    [provider]  dicyclomine (BENTYL) 10 MG capsule Take 1 capsule (10 mg total) by mouth 3 (three) times daily as needed for up to 14 days for spasms. or abdominal pain 10/04/17 10/18/17  Hinda Kehr, MD  EPINEPHrine 0.3 mg/0.3 mL IJ SOAJ injection Inject 0.3 mg into the muscle daily as needed for anaphylaxis.  02/13/16   [provider]  ibuprofen (ADVIL,MOTRIN) 600 MG tablet Take 1 tablet (600 mg total) by mouth every 8 (eight) hours as needed. 10/28/16   Sable Feil, PA-C  mupirocin cream (BACTROBAN) 2 % Apply to affected area 3 times daily 12/25/18 12/25/19  Sherrie George B, FNP  ondansetron (ZOFRAN ODT) 4 MG disintegrating tablet Allow 1-2 tablets to dissolve in your mouth every 8 hours as needed for nausea/vomiting 10/04/17   Hinda Kehr, MD  prazosin (MINIPRESS) 2 MG capsule Take 2 mg by mouth at bedtime. 05/06/16   [provider]   QUEtiapine (SEROQUEL) 50 MG tablet Take 50 mg by mouth 2 (two) times daily as needed for anxiety or sleep. 02/21/16   [provider]  triamcinolone cream (KENALOG) 0.1 % Apply 1 application topically 2 (two) times daily as needed. For skin rashes. 03/11/16   [provider]    Allergies Tape  Family History  Problem Relation Age of Onset  . Hypertension Mother   . Hyperlipidemia Mother   . Hypertension Father   . Hyperlipidemia Father   . Diabetes Father     Social History Social History   Tobacco Use  . Smoking status: Never Smoker  . Smokeless tobacco: Never Used  Substance Use Topics  . Alcohol use: No  . Drug use: No    Review of Systems  Constitutional: Negative for fever. Respiratory: Negative for cough or shortness of breath.  Musculoskeletal: Positive for myalgias Skin: Positive for wound on right anterior thigh. Neurological: Negative for numbness or paresthesias. ____________________________________________   PHYSICAL EXAM:  VITAL SIGNS: ED Triage Vitals  Enc Vitals Group     BP 12/25/18 1204 131/86     Pulse Rate 12/25/18 1204 (!) 113     Resp 12/25/18 1204 20     Temp 12/25/18 1204 98.4 F (36.9 C)     Temp Source 12/25/18 1204 Oral     SpO2 12/25/18 1204 100 %     Weight 12/25/18 1205 135 lb (61.2 kg)     Height 12/25/18 1205 5\' 5"  (1.651 m)     Head Circumference --      Peak Flow --      Pain Score 12/25/18 1204 9     Pain Loc --      Pain Edu? --      Excl. in [place]? --      Constitutional: Well appearing. Eyes: Conjunctivae are clear without discharge or drainage. Nose: No rhinorrhea noted. Mouth/Throat: Airway is patent.  Neck: No stridor. Unrestricted range of motion observed. Cardiovascular: Capillary refill is <3 seconds.  Respiratory: Respirations are even and unlabored.. Musculoskeletal: Unrestricted range of motion observed. Neurologic: Awake, alert, and oriented x 4.  Skin:  Puncture wound in area of  erythema that is surrounded by ecchymosis on the anterior right upper thigh. No drainage or lymphangitis.  ____________________________________________   LABS (all labs ordered are listed, but only abnormal results are displayed)  Labs Reviewed - No data to display ____________________________________________  EKG  Not indicated. ____________________________________________  RADIOLOGY  Femur image does not show sign of foreign body. ____________________________________________   PROCEDURES  Procedures ____________________________________________   INITIAL IMPRESSION / ASSESSMENT AND PLAN / ED COURSE  KENDLE TURBIN is a 33 y.o. female presents to the ER for initial rabies injections and treatment of wound from dog bite. She will be given Augmentin and mupirocin ointment. She is to continue her Suboxone and may add ibuprofen if needed for pain.  Return dates also will be given for remainder of rabies injections.  Medications  amoxicillin-clavulanate (AUGMENTIN) 875-125 MG per tablet 1 tablet (1 tablet Oral Given 12/25/18 1238)  naproxen (NAPROSYN) tablet 500 mg (500 mg Oral Given 12/25/18 1239)  rabies vaccine (RABAVERT) injection 1 mL (1 mL Intramuscular Given 12/25/18 1239)  rabies immune globulin (HYPERAB/KEDRAB) injection 1,200 Units (1,200 Units Intramuscular Given 12/25/18 1241)     Pertinent labs & imaging results that were available during my care of the patient were reviewed by me and considered in my medical decision making (see chart for details).  ____________________________________________   FINAL CLINICAL IMPRESSION(S) / ED DIAGNOSES  Final diagnoses:  Need for post exposure prophylaxis for rabies  Dog bite, subsequent encounter    ED Discharge Orders         Ordered    amoxicillin-clavulanate (AUGMENTIN) 875-125 MG tablet  2 times daily     12/25/18 1300    mupirocin cream (BACTROBAN) 2 %     12/25/18 1300           Note:  This document was  prepared using Dragon voice recognition software and may include unintentional dictation errors.   Victorino Dike, FNP 12/25/18 1322    Schuyler Amor, MD 12/25/18 1336

## 2018-12-25 NOTE — ED Triage Notes (Signed)
Pt here for dog bite that occurred on her right thigh and states it is getting worse. Has a red duration around it, pain increased and radiating, swelling and pus drainage from it. Was at her house when she got bit. It was a pitbull breed. Unsure whose dog it was nor the vaccination status. Think it was a Doctor, hospital. Last tetanus vaccine was 4 years ago.

## 2018-12-25 NOTE — Discharge Instructions (Addendum)
Go directly to the emergency room as discussed.  °

## 2018-12-25 NOTE — ED Notes (Signed)
First Nurse Note: Provider from [facility] called stating that pt was bit by an unknown dog on Wednesday, animal control was unable to locate animal, pt needs rabies injection
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
## 2020-08-30 ENCOUNTER — Emergency Department: Payer: Medicaid Other

## 2020-08-30 ENCOUNTER — Encounter: Payer: Self-pay | Admitting: Emergency Medicine

## 2020-08-30 ENCOUNTER — Other Ambulatory Visit: Payer: Self-pay

## 2020-08-30 ENCOUNTER — Ambulatory Visit
Admission: EM | Admit: 2020-08-30 | Discharge: 2020-08-30 | Disposition: A | Discharge status: Other Acute Inpatient Hospital | Payer: Medicaid Other

## 2020-08-30 ENCOUNTER — Emergency Department
Admission: EM | Admit: 2020-08-30 | Discharge: 2020-08-31 | Disposition: A | Discharge status: Home / Self Care | Payer: Medicaid Other | Attending: Emergency Medicine | Admitting: Emergency Medicine

## 2020-08-30 DIAGNOSIS — S2020XA Contusion of thorax, unspecified, initial encounter: Secondary | ICD-10-CM | POA: Diagnosis not present

## 2020-08-30 DIAGNOSIS — S40011A Contusion of right shoulder, initial encounter: Secondary | ICD-10-CM | POA: Insufficient documentation

## 2020-08-30 DIAGNOSIS — R42 Dizziness and giddiness: Secondary | ICD-10-CM | POA: Diagnosis not present

## 2020-08-30 DIAGNOSIS — M542 Cervicalgia: Secondary | ICD-10-CM | POA: Diagnosis not present

## 2020-08-30 DIAGNOSIS — Y92410 Unspecified street and highway as the place of occurrence of the external cause: Secondary | ICD-10-CM | POA: Insufficient documentation

## 2020-08-30 DIAGNOSIS — S060X9A Concussion with loss of consciousness of unspecified duration, initial encounter: Secondary | ICD-10-CM | POA: Diagnosis not present

## 2020-08-30 DIAGNOSIS — M791 Myalgia, unspecified site: Secondary | ICD-10-CM | POA: Insufficient documentation

## 2020-08-30 DIAGNOSIS — M549 Dorsalgia, unspecified: Secondary | ICD-10-CM | POA: Diagnosis not present

## 2020-08-30 DIAGNOSIS — S1093XA Contusion of unspecified part of neck, initial encounter: Secondary | ICD-10-CM | POA: Insufficient documentation

## 2020-08-30 DIAGNOSIS — R202 Paresthesia of skin: Secondary | ICD-10-CM | POA: Diagnosis not present

## 2020-08-30 DIAGNOSIS — R519 Headache, unspecified: Secondary | ICD-10-CM

## 2020-08-30 DIAGNOSIS — S0990XA Unspecified injury of head, initial encounter: Secondary | ICD-10-CM | POA: Diagnosis present

## 2020-08-30 DIAGNOSIS — T1490XA Injury, unspecified, initial encounter: Secondary | ICD-10-CM

## 2020-08-30 DIAGNOSIS — R200 Anesthesia of skin: Secondary | ICD-10-CM

## 2020-08-30 LAB — CBC WITH DIFFERENTIAL/PLATELET
Abs Immature Granulocytes: 0.02 10*3/uL (ref 0.00–0.07)
Basophils Absolute: 0.1 10*3/uL (ref 0.0–0.1)
Basophils Relative: 1 %
Eosinophils Absolute: 0.1 10*3/uL (ref 0.0–0.5)
Eosinophils Relative: 1 %
HCT: 40.3 % (ref 36.0–46.0)
Hemoglobin: 13.1 g/dL (ref 12.0–15.0)
Immature Granulocytes: 0 %
Lymphocytes Relative: 14 %
Lymphs Abs: 1.2 10*3/uL (ref 0.7–4.0)
MCH: 28.7 pg (ref 26.0–34.0)
MCHC: 32.5 g/dL (ref 30.0–36.0)
MCV: 88.2 fL (ref 80.0–100.0)
Monocytes Absolute: 0.3 10*3/uL (ref 0.1–1.0)
Monocytes Relative: 3 %
Neutro Abs: 7.2 10*3/uL (ref 1.7–7.7)
Neutrophils Relative %: 81 %
Platelets: 286 10*3/uL (ref 150–400)
RBC: 4.57 MIL/uL (ref 3.87–5.11)
RDW: 12.6 % (ref 11.5–15.5)
WBC: 8.9 10*3/uL (ref 4.0–10.5)
nRBC: 0 % (ref 0.0–0.2)

## 2020-08-30 LAB — COMPREHENSIVE METABOLIC PANEL
ALT: 24 U/L (ref 0–44)
AST: 28 U/L (ref 15–41)
Albumin: 4.2 g/dL (ref 3.5–5.0)
Alkaline Phosphatase: 77 U/L (ref 38–126)
Anion gap: 11 (ref 5–15)
BUN: 15 mg/dL (ref 6–20)
CO2: 25 mmol/L (ref 22–32)
Calcium: 9.3 mg/dL (ref 8.9–10.3)
Chloride: 100 mmol/L (ref 98–111)
Creatinine, Ser: 0.7 mg/dL (ref 0.44–1.00)
GFR, Estimated: >60 mL/min (ref >60)
Glucose, Bld: 112 mg/dL — ABNORMAL HIGH (ref 70–99)
Potassium: 4.3 mmol/L (ref 3.5–5.1)
Sodium: 136 mmol/L (ref 135–145)
Total Bilirubin: 0.8 mg/dL (ref 0.3–1.2)
Total Protein: 8.1 g/dL (ref 6.5–8.1)

## 2020-08-30 LAB — HCG, QUANTITATIVE, PREGNANCY: hCG, Beta Chain, Quant, S: <1 m[IU]/mL (ref <5)

## 2020-08-30 LAB — PROTIME-INR
INR: 1 (ref 0.8–1.2)
Prothrombin Time: 12.8 seconds (ref 11.4–15.2)

## 2020-08-30 MED ORDER — LACTATED RINGERS IV BOLUS
1000.0000 mL | Freq: Once | INTRAVENOUS | Status: AC
  Administered 2020-08-30: 1000 mL via INTRAVENOUS

## 2020-08-30 MED ORDER — MORPHINE SULFATE (PF) 4 MG/ML IV SOLN
4.0000 mg | Freq: Once | INTRAVENOUS | Status: AC
  Administered 2020-08-30: 4 mg via INTRAVENOUS
  Filled 2020-08-30: qty 1

## 2020-08-30 MED ORDER — ONDANSETRON HCL 4 MG/2ML IJ SOLN
4.0000 mg | Freq: Once | INTRAMUSCULAR | Status: AC
  Administered 2020-08-30: 4 mg via INTRAVENOUS
  Filled 2020-08-30: qty 2

## 2020-08-30 MED ORDER — IOHEXOL 350 MG/ML SOLN
100.0000 mL | Freq: Once | INTRAVENOUS | Status: AC | PRN
  Administered 2020-08-30: 100 mL via INTRAVENOUS
  Filled 2020-08-30: qty 100

## 2020-08-30 MED ORDER — ACETAMINOPHEN 500 MG PO TABS
1000.0000 mg | ORAL_TABLET | Freq: Once | ORAL | Status: AC
  Administered 2020-08-30: 1000 mg via ORAL
  Filled 2020-08-30: qty 2

## 2020-08-30 MED ORDER — METHOCARBAMOL 1000 MG/10ML IJ SOLN
1000.0000 mg | Freq: Once | INTRAMUSCULAR | Status: AC
  Administered 2020-08-30: 1000 mg via INTRAMUSCULAR
  Filled 2020-08-30: qty 10
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
## 2020-08-30 NOTE — ED Notes (Signed)
Patient is being discharged from the Urgent Care and sent to the Emergency Department via EMS . Per Margarette Canada NP, patient is in need of higher level of care due to current symptoms. Patient is aware and verbalizes understanding of plan of care.  Vitals:   08/30/20 1915  BP: (!) 127/97  Pulse: (!) 121  Resp: 18  Temp: 98.4 F (36.9 C)  SpO2: 100%
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
## 2020-08-30 NOTE — ED Notes (Signed)
Pt to CT at this time.

## 2020-08-30 NOTE — ED Notes (Signed)
CT called and made aware pt ready for CT
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
## 2020-08-30 NOTE — Discharge Instructions (Addendum)
As we discussed your symptoms are quite diverse and concerning given your spinal tenderness, decrease sensation to the left side of your face, headache, dizziness, and loss of consciousness at the time of injury.  For that reason I am recommending that she be evaluated in the emergency department at [facility] and I am recommending that she be transported there by ambulance.

## 2020-08-31 MED ORDER — KETOROLAC TROMETHAMINE 30 MG/ML IJ SOLN
30.0000 mg | Freq: Once | INTRAMUSCULAR | Status: AC
  Administered 2020-08-31: 30 mg via INTRAVENOUS
  Filled 2020-08-31: qty 1

## 2020-08-31 MED ORDER — ONDANSETRON HCL 4 MG/2ML IJ SOLN
4.0000 mg | Freq: Once | INTRAMUSCULAR | Status: AC
  Administered 2020-08-31: 4 mg via INTRAVENOUS
  Filled 2020-08-31: qty 2

## 2020-08-31 NOTE — ED Notes (Signed)
Attempted to ambulate pt. Pt states she feels very dizzy and "rush to her head" when she stands up. Pt sitting up in stretcher now, will attempt to ambulate again. Tamala Julian MD made aware

## 2020-08-31 NOTE — ED Notes (Signed)
Pt ambulatory a few steps in room, then gets very dizzy. Pt instructed when she gets home she will need to rest for several days

## 2020-09-20 DIAGNOSIS — M459 Ankylosing spondylitis of unspecified sites in spine: Secondary | ICD-10-CM | POA: Insufficient documentation

## 2020-09-20 DIAGNOSIS — M25519 Pain in unspecified shoulder: Secondary | ICD-10-CM | POA: Insufficient documentation

## 2020-10-23 ENCOUNTER — Other Ambulatory Visit: Payer: Self-pay | Admitting: Physician Assistant

## 2020-10-23 DIAGNOSIS — M542 Cervicalgia: Secondary | ICD-10-CM

## 2020-10-23 DIAGNOSIS — F0781 Postconcussional syndrome: Secondary | ICD-10-CM

## 2020-11-05 ENCOUNTER — Other Ambulatory Visit: Payer: Self-pay

## 2020-11-05 ENCOUNTER — Ambulatory Visit
Admission: RE | Admit: 2020-11-05 | Discharge: 2020-11-05 | Disposition: A | Discharge status: Home / Self Care | Payer: Medicaid Other | Source: Ambulatory Visit | Attending: Physician Assistant | Admitting: Physician Assistant

## 2020-11-05 DIAGNOSIS — F0781 Postconcussional syndrome: Secondary | ICD-10-CM | POA: Diagnosis present

## 2020-11-07 ENCOUNTER — Ambulatory Visit
Admission: RE | Admit: 2020-11-07 | Discharge: 2020-11-07 | Disposition: A | Discharge status: Home / Self Care | Payer: Medicaid Other | Source: Ambulatory Visit | Attending: Physician Assistant | Admitting: Physician Assistant

## 2020-11-07 ENCOUNTER — Other Ambulatory Visit: Payer: Self-pay

## 2020-11-07 DIAGNOSIS — M542 Cervicalgia: Secondary | ICD-10-CM | POA: Insufficient documentation

## 2020-12-05 DIAGNOSIS — R200 Anesthesia of skin: Secondary | ICD-10-CM | POA: Insufficient documentation

## 2020-12-05 DIAGNOSIS — F0781 Postconcussional syndrome: Secondary | ICD-10-CM | POA: Insufficient documentation

## 2021-06-21 ENCOUNTER — Inpatient Hospital Stay
Admission: EM | Admit: 2021-06-21 | Discharge: 2021-06-24 | DRG: 896 | Disposition: A | Discharge status: Home / Self Care | Payer: Medicaid Other | Attending: Student | Admitting: Student

## 2021-06-21 ENCOUNTER — Inpatient Hospital Stay: Payer: Medicaid Other

## 2021-06-21 ENCOUNTER — Emergency Department: Payer: Medicaid Other

## 2021-06-21 ENCOUNTER — Other Ambulatory Visit: Payer: Self-pay

## 2021-06-21 DIAGNOSIS — F4310 Post-traumatic stress disorder, unspecified: Secondary | ICD-10-CM | POA: Diagnosis present

## 2021-06-21 DIAGNOSIS — I248 Other forms of acute ischemic heart disease: Secondary | ICD-10-CM | POA: Diagnosis present

## 2021-06-21 DIAGNOSIS — R000 Tachycardia, unspecified: Secondary | ICD-10-CM | POA: Diagnosis not present

## 2021-06-21 DIAGNOSIS — W19XXXA Unspecified fall, initial encounter: Secondary | ICD-10-CM | POA: Diagnosis present

## 2021-06-21 DIAGNOSIS — E870 Hyperosmolality and hypernatremia: Secondary | ICD-10-CM | POA: Diagnosis present

## 2021-06-21 DIAGNOSIS — E876 Hypokalemia: Secondary | ICD-10-CM

## 2021-06-21 DIAGNOSIS — D72825 Bandemia: Secondary | ICD-10-CM | POA: Diagnosis present

## 2021-06-21 DIAGNOSIS — R6510 Systemic inflammatory response syndrome (SIRS) of non-infectious origin without acute organ dysfunction: Secondary | ICD-10-CM | POA: Diagnosis present

## 2021-06-21 DIAGNOSIS — R4182 Altered mental status, unspecified: Secondary | ICD-10-CM | POA: Diagnosis present

## 2021-06-21 DIAGNOSIS — F900 Attention-deficit hyperactivity disorder, predominantly inattentive type: Secondary | ICD-10-CM

## 2021-06-21 DIAGNOSIS — Z91048 Other nonmedicinal substance allergy status: Secondary | ICD-10-CM | POA: Diagnosis not present

## 2021-06-21 DIAGNOSIS — F22 Delusional disorders: Secondary | ICD-10-CM | POA: Diagnosis not present

## 2021-06-21 DIAGNOSIS — E860 Dehydration: Secondary | ICD-10-CM | POA: Diagnosis present

## 2021-06-21 DIAGNOSIS — E872 Acidosis, unspecified: Secondary | ICD-10-CM | POA: Diagnosis present

## 2021-06-21 DIAGNOSIS — F909 Attention-deficit hyperactivity disorder, unspecified type: Secondary | ICD-10-CM | POA: Diagnosis present

## 2021-06-21 DIAGNOSIS — I160 Hypertensive urgency: Secondary | ICD-10-CM | POA: Diagnosis present

## 2021-06-21 DIAGNOSIS — F13231 Sedative, hypnotic or anxiolytic dependence with withdrawal delirium: Secondary | ICD-10-CM | POA: Diagnosis present

## 2021-06-21 DIAGNOSIS — G8929 Other chronic pain: Secondary | ICD-10-CM | POA: Diagnosis present

## 2021-06-21 DIAGNOSIS — M797 Fibromyalgia: Secondary | ICD-10-CM | POA: Diagnosis present

## 2021-06-21 DIAGNOSIS — I10 Essential (primary) hypertension: Secondary | ICD-10-CM | POA: Diagnosis present

## 2021-06-21 DIAGNOSIS — G9341 Metabolic encephalopathy: Secondary | ICD-10-CM

## 2021-06-21 DIAGNOSIS — F13250 Sedative, hypnotic or anxiolytic dependence with sedative, hypnotic or anxiolytic-induced psychotic disorder with delusions: Secondary | ICD-10-CM | POA: Diagnosis present

## 2021-06-21 DIAGNOSIS — F419 Anxiety disorder, unspecified: Secondary | ICD-10-CM | POA: Diagnosis not present

## 2021-06-21 DIAGNOSIS — F0781 Postconcussional syndrome: Secondary | ICD-10-CM | POA: Diagnosis present

## 2021-06-21 DIAGNOSIS — M75101 Unspecified rotator cuff tear or rupture of right shoulder, not specified as traumatic: Secondary | ICD-10-CM | POA: Diagnosis present

## 2021-06-21 DIAGNOSIS — F19950 Other psychoactive substance use, unspecified with psychoactive substance-induced psychotic disorder with delusions: Secondary | ICD-10-CM | POA: Diagnosis not present

## 2021-06-21 DIAGNOSIS — M549 Dorsalgia, unspecified: Secondary | ICD-10-CM | POA: Diagnosis present

## 2021-06-21 DIAGNOSIS — Y92002 Bathroom of unspecified non-institutional (private) residence single-family (private) house as the place of occurrence of the external cause: Secondary | ICD-10-CM | POA: Diagnosis not present

## 2021-06-21 DIAGNOSIS — F32A Depression, unspecified: Secondary | ICD-10-CM

## 2021-06-21 DIAGNOSIS — Z20822 Contact with and (suspected) exposure to covid-19: Secondary | ICD-10-CM | POA: Diagnosis present

## 2021-06-21 DIAGNOSIS — D75839 Thrombocytosis, unspecified: Secondary | ICD-10-CM | POA: Diagnosis present

## 2021-06-21 DIAGNOSIS — K219 Gastro-esophageal reflux disease without esophagitis: Secondary | ICD-10-CM | POA: Diagnosis present

## 2021-06-21 DIAGNOSIS — D751 Secondary polycythemia: Secondary | ICD-10-CM

## 2021-06-21 DIAGNOSIS — M545 Low back pain, unspecified: Secondary | ICD-10-CM

## 2021-06-21 DIAGNOSIS — Z8249 Family history of ischemic heart disease and other diseases of the circulatory system: Secondary | ICD-10-CM

## 2021-06-21 DIAGNOSIS — Z79891 Long term (current) use of opiate analgesic: Secondary | ICD-10-CM

## 2021-06-21 DIAGNOSIS — F13931 Sedative, hypnotic or anxiolytic use, unspecified with withdrawal delirium: Secondary | ICD-10-CM

## 2021-06-21 DIAGNOSIS — Z79899 Other long term (current) drug therapy: Secondary | ICD-10-CM

## 2021-06-21 DIAGNOSIS — M5441 Lumbago with sciatica, right side: Secondary | ICD-10-CM | POA: Diagnosis not present

## 2021-06-21 LAB — BASIC METABOLIC PANEL
Anion gap: 14 (ref 5–15)
BUN: 45 mg/dL — ABNORMAL HIGH (ref 6–20)
CO2: 23 mmol/L (ref 22–32)
Calcium: 9.5 mg/dL (ref 8.9–10.3)
Chloride: 104 mmol/L (ref 98–111)
Creatinine, Ser: 0.83 mg/dL (ref 0.44–1.00)
GFR, Estimated: >60 mL/min (ref >60)
Glucose, Bld: 167 mg/dL — ABNORMAL HIGH (ref 70–99)
Potassium: 3 mmol/L — ABNORMAL LOW (ref 3.5–5.1)
Sodium: 141 mmol/L (ref 135–145)

## 2021-06-21 LAB — CBC WITH DIFFERENTIAL/PLATELET
Abs Immature Granulocytes: 0.07 10*3/uL (ref 0.00–0.07)
Basophils Absolute: 0 10*3/uL (ref 0.0–0.1)
Basophils Relative: 0 %
Eosinophils Absolute: 0 10*3/uL (ref 0.0–0.5)
Eosinophils Relative: 0 %
HCT: 50.3 % — ABNORMAL HIGH (ref 36.0–46.0)
Hemoglobin: 17.7 g/dL — ABNORMAL HIGH (ref 12.0–15.0)
Immature Granulocytes: 1 %
Lymphocytes Relative: 12 %
Lymphs Abs: 1.7 10*3/uL (ref 0.7–4.0)
MCH: 29.5 pg (ref 26.0–34.0)
MCHC: 35.2 g/dL (ref 30.0–36.0)
MCV: 84 fL (ref 80.0–100.0)
Monocytes Absolute: 1.3 10*3/uL — ABNORMAL HIGH (ref 0.1–1.0)
Monocytes Relative: 9 %
Neutro Abs: 11.5 10*3/uL — ABNORMAL HIGH (ref 1.7–7.7)
Neutrophils Relative %: 78 %
Platelets: 439 10*3/uL — ABNORMAL HIGH (ref 150–400)
RBC: 5.99 MIL/uL — ABNORMAL HIGH (ref 3.87–5.11)
RDW: 12.7 % (ref 11.5–15.5)
WBC: 14.6 10*3/uL — ABNORMAL HIGH (ref 4.0–10.5)
nRBC: 0 % (ref 0.0–0.2)

## 2021-06-21 LAB — URINALYSIS, ROUTINE W REFLEX MICROSCOPIC
Bilirubin Urine: NEGATIVE
Glucose, UA: 50 mg/dL — AB
Ketones, ur: 20 mg/dL — AB
Leukocytes,Ua: NEGATIVE
Nitrite: NEGATIVE
Protein, ur: >=300 mg/dL — AB
Specific Gravity, Urine: 1.024 (ref 1.005–1.030)
pH: 5 (ref 5.0–8.0)

## 2021-06-21 LAB — SALICYLATE LEVEL: Salicylate Lvl: <7 mg/dL — ABNORMAL LOW (ref 7.0–30.0)

## 2021-06-21 LAB — ETHANOL: Alcohol, Ethyl (B): <10 mg/dL (ref <10)

## 2021-06-21 LAB — URINE DRUG SCREEN, QUALITATIVE (ARMC ONLY)
Amphetamines, Ur Screen: NOT DETECTED
Barbiturates, Ur Screen: NOT DETECTED
Benzodiazepine, Ur Scrn: NOT DETECTED
Cannabinoid 50 Ng, Ur ~~LOC~~: NOT DETECTED
Cocaine Metabolite,Ur ~~LOC~~: NOT DETECTED
MDMA (Ecstasy)Ur Screen: NOT DETECTED
Methadone Scn, Ur: NOT DETECTED
Opiate, Ur Screen: NOT DETECTED
Phencyclidine (PCP) Ur S: NOT DETECTED
Tricyclic, Ur Screen: NOT DETECTED

## 2021-06-21 LAB — HEPATIC FUNCTION PANEL
ALT: 12 U/L (ref 0–44)
AST: 23 U/L (ref 15–41)
Albumin: 4.5 g/dL (ref 3.5–5.0)
Alkaline Phosphatase: 66 U/L (ref 38–126)
Bilirubin, Direct: 0.2 mg/dL (ref 0.0–0.2)
Indirect Bilirubin: 0.9 mg/dL (ref 0.3–0.9)
Total Bilirubin: 1.1 mg/dL (ref 0.3–1.2)
Total Protein: 8.7 g/dL — ABNORMAL HIGH (ref 6.5–8.1)

## 2021-06-21 LAB — RESP PANEL BY RT-PCR (FLU A&B, COVID) ARPGX2
Influenza A by PCR: NEGATIVE
Influenza B by PCR: NEGATIVE
SARS Coronavirus 2 by RT PCR: NEGATIVE

## 2021-06-21 LAB — TROPONIN I (HIGH SENSITIVITY): Troponin I (High Sensitivity): 26 ng/L — ABNORMAL HIGH (ref <18)

## 2021-06-21 LAB — TSH: TSH: 1.002 u[IU]/mL (ref 0.350–4.500)

## 2021-06-21 LAB — ACETAMINOPHEN LEVEL: Acetaminophen (Tylenol), Serum: <10 ug/mL — ABNORMAL LOW (ref 10–30)

## 2021-06-21 LAB — LACTIC ACID, PLASMA: Lactic Acid, Venous: 2.4 mmol/L (ref 0.5–1.9)

## 2021-06-21 MED ORDER — SODIUM CHLORIDE 0.9 % IV BOLUS
1000.0000 mL | Freq: Once | INTRAVENOUS | Status: AC
  Administered 2021-06-21: 1000 mL via INTRAVENOUS

## 2021-06-21 MED ORDER — ACETAMINOPHEN 650 MG RE SUPP: 650.0000 mg | Freq: Four times a day (QID) | RECTAL | Status: DC | PRN

## 2021-06-21 MED ORDER — LORAZEPAM 2 MG/ML IJ SOLN: 0.0000 mg | Freq: Two times a day (BID) | INTRAMUSCULAR | Status: DC

## 2021-06-21 MED ORDER — ONDANSETRON HCL 4 MG/2ML IJ SOLN: 4.0000 mg | Freq: Four times a day (QID) | INTRAMUSCULAR | Status: DC | PRN

## 2021-06-21 MED ORDER — LORAZEPAM 2 MG/ML IJ SOLN
0.0000 mg | Freq: Four times a day (QID) | INTRAMUSCULAR | Status: DC
  Administered 2021-06-22: 2 mg via INTRAVENOUS
  Filled 2021-06-21: qty 1

## 2021-06-21 MED ORDER — ENOXAPARIN SODIUM 40 MG/0.4ML IJ SOSY
40.0000 mg | PREFILLED_SYRINGE | INTRAMUSCULAR | Status: DC
  Administered 2021-06-21 – 2021-06-23 (×3): 40 mg via SUBCUTANEOUS
  Filled 2021-06-21 (×3): qty 0.4

## 2021-06-21 MED ORDER — LORAZEPAM 2 MG/ML IJ SOLN
2.0000 mg | Freq: Once | INTRAMUSCULAR | Status: AC
  Administered 2021-06-21: 2 mg via INTRAVENOUS
  Filled 2021-06-21: qty 1

## 2021-06-21 MED ORDER — LORAZEPAM 2 MG PO TABS: 0.0000 mg | ORAL_TABLET | Freq: Four times a day (QID) | ORAL | Status: DC

## 2021-06-21 MED ORDER — HYDROCODONE-ACETAMINOPHEN 5-325 MG PO TABS
1.0000 | ORAL_TABLET | ORAL | Status: DC | PRN
  Administered 2021-06-22: 1 via ORAL
  Filled 2021-06-21: qty 1

## 2021-06-21 MED ORDER — POTASSIUM CHLORIDE CRYS ER 20 MEQ PO TBCR
40.0000 meq | EXTENDED_RELEASE_TABLET | Freq: Once | ORAL | Status: AC
  Administered 2021-06-22: 40 meq via ORAL
  Filled 2021-06-21: qty 2

## 2021-06-21 MED ORDER — THIAMINE HCL 100 MG PO TABS: 100.0000 mg | ORAL_TABLET | Freq: Every day | ORAL | Status: DC

## 2021-06-21 MED ORDER — ONDANSETRON HCL 4 MG PO TABS: 4.0000 mg | ORAL_TABLET | Freq: Four times a day (QID) | ORAL | Status: DC | PRN

## 2021-06-21 MED ORDER — LORAZEPAM 2 MG PO TABS: 0.0000 mg | ORAL_TABLET | Freq: Two times a day (BID) | ORAL | Status: DC

## 2021-06-21 MED ORDER — THIAMINE HCL 100 MG/ML IJ SOLN
100.0000 mg | Freq: Every day | INTRAMUSCULAR | Status: DC
  Administered 2021-06-21 – 2021-06-22 (×2): 100 mg via INTRAVENOUS
  Filled 2021-06-21 (×2): qty 2

## 2021-06-21 MED ORDER — IOHEXOL 350 MG/ML SOLN
75.0000 mL | Freq: Once | INTRAVENOUS | Status: AC | PRN
  Administered 2021-06-21: 75 mL via INTRAVENOUS

## 2021-06-21 MED ORDER — SODIUM CHLORIDE 0.9 % IV SOLN: INTRAVENOUS | Status: DC

## 2021-06-21 MED ORDER — ACETAMINOPHEN 325 MG PO TABS: 650.0000 mg | ORAL_TABLET | Freq: Four times a day (QID) | ORAL | Status: DC | PRN

## 2021-06-21 MED ORDER — METOPROLOL TARTRATE 5 MG/5ML IV SOLN
5.0000 mg | Freq: Once | INTRAVENOUS | Status: AC
  Administered 2021-06-21: 5 mg via INTRAVENOUS
  Filled 2021-06-21: qty 5

## 2021-06-21 NOTE — ED Notes (Signed)
EDP verbally notified of pt continued ST.  No new orders at this time.

## 2021-06-21 NOTE — ED Notes (Signed)
Pt resting with eyes closed, respirations tachy and unlabored.  Repositioned pt arm to allow for fluid bolus completion.

## 2021-06-21 NOTE — ED Notes (Signed)
Pt AOx4 at this time, states that she passed out at [facility] and that God brought her back "because he said I was not called for punishment."
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
## 2021-06-21 NOTE — ED Notes (Addendum)
Pt told today is Friday. She states that the last day she remembers is Monday.  Pt denies substance or alcohol use.  When asked why she thought she didn't know what day it was, she stated "because I was dead."
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
## 2021-06-21 NOTE — ED Notes (Signed)
Critical lactic of 2.4 per lab. EDP notified.

## 2021-06-21 NOTE — ED Notes (Signed)
Repositioned pt arm to allow for fluid bolus completion.

## 2021-06-22 ENCOUNTER — Inpatient Hospital Stay: Payer: Medicaid Other

## 2021-06-22 DIAGNOSIS — E872 Acidosis, unspecified: Secondary | ICD-10-CM

## 2021-06-22 DIAGNOSIS — F32A Depression, unspecified: Secondary | ICD-10-CM

## 2021-06-22 DIAGNOSIS — F419 Anxiety disorder, unspecified: Secondary | ICD-10-CM

## 2021-06-22 DIAGNOSIS — E876 Hypokalemia: Secondary | ICD-10-CM

## 2021-06-22 DIAGNOSIS — D751 Secondary polycythemia: Secondary | ICD-10-CM

## 2021-06-22 DIAGNOSIS — E870 Hyperosmolality and hypernatremia: Secondary | ICD-10-CM

## 2021-06-22 DIAGNOSIS — I160 Hypertensive urgency: Secondary | ICD-10-CM

## 2021-06-22 DIAGNOSIS — F22 Delusional disorders: Secondary | ICD-10-CM

## 2021-06-22 DIAGNOSIS — R000 Tachycardia, unspecified: Secondary | ICD-10-CM

## 2021-06-22 DIAGNOSIS — M5441 Lumbago with sciatica, right side: Secondary | ICD-10-CM

## 2021-06-22 DIAGNOSIS — G8929 Other chronic pain: Secondary | ICD-10-CM

## 2021-06-22 DIAGNOSIS — F909 Attention-deficit hyperactivity disorder, unspecified type: Secondary | ICD-10-CM

## 2021-06-22 DIAGNOSIS — R6510 Systemic inflammatory response syndrome (SIRS) of non-infectious origin without acute organ dysfunction: Secondary | ICD-10-CM

## 2021-06-22 LAB — COMPREHENSIVE METABOLIC PANEL
ALT: 9 U/L (ref 0–44)
AST: 19 U/L (ref 15–41)
Albumin: 3.5 g/dL (ref 3.5–5.0)
Alkaline Phosphatase: 57 U/L (ref 38–126)
Anion gap: 10 (ref 5–15)
BUN: 25 mg/dL — ABNORMAL HIGH (ref 6–20)
CO2: 27 mmol/L (ref 22–32)
Calcium: 8.8 mg/dL — ABNORMAL LOW (ref 8.9–10.3)
Chloride: 109 mmol/L (ref 98–111)
Creatinine, Ser: 0.63 mg/dL (ref 0.44–1.00)
GFR, Estimated: >60 mL/min (ref >60)
Glucose, Bld: 101 mg/dL — ABNORMAL HIGH (ref 70–99)
Potassium: 3.4 mmol/L — ABNORMAL LOW (ref 3.5–5.1)
Sodium: 146 mmol/L — ABNORMAL HIGH (ref 135–145)
Total Bilirubin: 1.1 mg/dL (ref 0.3–1.2)
Total Protein: 6.7 g/dL (ref 6.5–8.1)

## 2021-06-22 LAB — CBC
HCT: 41.2 % (ref 36.0–46.0)
Hemoglobin: 13.8 g/dL (ref 12.0–15.0)
MCH: 29.2 pg (ref 26.0–34.0)
MCHC: 33.5 g/dL (ref 30.0–36.0)
MCV: 87.1 fL (ref 80.0–100.0)
Platelets: 289 10*3/uL (ref 150–400)
RBC: 4.73 MIL/uL (ref 3.87–5.11)
RDW: 12.8 % (ref 11.5–15.5)
WBC: 11.2 10*3/uL — ABNORMAL HIGH (ref 4.0–10.5)
nRBC: 0 % (ref 0.0–0.2)

## 2021-06-22 LAB — PREGNANCY, URINE: Preg Test, Ur: NEGATIVE

## 2021-06-22 LAB — HIV ANTIBODY (ROUTINE TESTING W REFLEX): HIV Screen 4th Generation wRfx: NONREACTIVE

## 2021-06-22 LAB — LACTIC ACID, PLASMA
Lactic Acid, Venous: 1.3 mmol/L (ref 0.5–1.9)
Lactic Acid, Venous: 2.7 mmol/L (ref 0.5–1.9)

## 2021-06-22 LAB — TROPONIN I (HIGH SENSITIVITY): Troponin I (High Sensitivity): 33 ng/L — ABNORMAL HIGH (ref <18)

## 2021-06-22 LAB — MAGNESIUM: Magnesium: 2.4 mg/dL (ref 1.7–2.4)

## 2021-06-22 MED ORDER — LORAZEPAM 2 MG/ML IJ SOLN: 0.5000 mg | Freq: Four times a day (QID) | INTRAMUSCULAR | Status: DC | PRN

## 2021-06-22 MED ORDER — HYDRALAZINE HCL 25 MG PO TABS: 25.0000 mg | ORAL_TABLET | Freq: Four times a day (QID) | ORAL | Status: DC | PRN

## 2021-06-22 MED ORDER — SODIUM CHLORIDE 0.45 % IV SOLN: INTRAVENOUS | Status: DC

## 2021-06-22 MED ORDER — POTASSIUM CHLORIDE CRYS ER 20 MEQ PO TBCR
40.0000 meq | EXTENDED_RELEASE_TABLET | ORAL | Status: AC
  Administered 2021-06-22 (×2): 40 meq via ORAL
  Filled 2021-06-22 (×2): qty 2

## 2021-06-22 MED ORDER — THIAMINE HCL 100 MG PO TABS
100.0000 mg | ORAL_TABLET | Freq: Every day | ORAL | Status: DC
  Administered 2021-06-22 – 2021-06-24 (×3): 100 mg via ORAL
  Filled 2021-06-22 (×3): qty 1

## 2021-06-22 MED ORDER — TAB-A-VITE/IRON PO TABS
1.0000 | ORAL_TABLET | Freq: Every day | ORAL | Status: DC
  Administered 2021-06-22 – 2021-06-24 (×3): 1 via ORAL
  Filled 2021-06-22 (×3): qty 1

## 2021-06-22 MED ORDER — GADOBUTROL 1 MMOL/ML IV SOLN
6.0000 mL | Freq: Once | INTRAVENOUS | Status: AC | PRN
  Administered 2021-06-22: 6 mL via INTRAVENOUS

## 2021-06-22 MED ORDER — HYDROCODONE-ACETAMINOPHEN 5-325 MG PO TABS
1.0000 | ORAL_TABLET | Freq: Four times a day (QID) | ORAL | Status: DC | PRN
  Administered 2021-06-22 – 2021-06-23 (×3): 1 via ORAL
  Filled 2021-06-22 (×3): qty 1
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
## 2021-06-22 NOTE — Progress Notes (Signed)
PROGRESS NOTE  Summer Buckley IWO:[id number] DOB: 06/05/1985   PCP: [facility]  Patient is from: Home.  Lives with parents.  DOA: 06/21/2021 LOS: 1  Chief complaints:  Chief Complaint  Patient presents with   Altered Mental Status     Brief Narrative / Interim history: 36 year old F with PMH of ADHD, chronic pain on opiate, postconcussive syndrome followed by neurology outpatient, chronic rotator cuff injury of right shoulder, anxiety, depression and "paroxysmal vocal cord closure in 2017" brought to ED with altered mental status, fall in bathroom and delusion.  Per patient's father, patient has been refusing to eat and drink over the last 3 to 4 days.  Eventually she became weak and fell in the bathroom.  He does not think she hit her head.  Denies loss of consciousness.    Patient was admitted for acute metabolic encephalopathy, elevated blood pressure, SIRS and possible polycythemia with thrombocytosis.  CT head, CTA chest and CXR without acute finding.  Right shoulder x-ray without acute osseous finding.  She was started on IV fluid.   Subjective: Seen and examined earlier this morning.  Patient's father at bedside.  Patient continues to endorse right shoulder pain.  She rates her pain 8/10.  Per patient's father, patient has rotator cuff injury for years.  She has history of chronic pain as well.  She reports numbness in right arm which seems to be chronic as well.  After the further work-up at home, patient states "I was dead and taken to the grave but God sent me back to earth".  Objective: Vitals:   06/22/21 0436 06/22/21 0726 06/22/21 1127 06/22/21 1330  BP: (!) 132/103 (!) 136/93 (!) 134/99 (!) 140/97  Pulse: (!) 106 (!) 105 100 94  Resp: 20 16 16    Temp: 97.7 F (36.5 C) 99.2 F (37.3 C) 99.7 F (37.6 C)   TempSrc: Oral Oral Oral   SpO2: 99% 100% 99%   Weight:      Height:        Examination:  GENERAL: No apparent distress.  Nontoxic. HEENT:  MMM.  Vision and hearing grossly intact.  NECK: Supple.  No apparent JVD.  RESP: 99% on RA.  No IWOB.  Fair aeration bilaterally. CVS:  RRR. Heart sounds normal.  ABD/GI/GU: BS+. Abd soft, NTND.  MSK/EXT:  Moves extremities.  Weakness in right arm likely from chronic rotator cuff injury. SKIN: no apparent skin lesion or wound NEURO: Awake, alert and oriented x4 except date.  Follows commands.  No apparent focal neuro deficit. PSYCH: Calm.  Seems to be delusional.  Procedures:  None  Microbiology summarized: COVID-19 and influenza PCR nonreactive.  Assessment & Plan: Acute metabolic encephalopathy/delusions-patient with multiple psychiatric conditions including ADHD, anxiety, depression, PTSD and postconcussion syndrome.  She is currently delusional thinking she was dead taken to grave but sent back to earth by God.  Per patient's father, she was refusing to eat or drink for the last 3 to 4 days, and became weak and fell in the bathroom.  Patient's mother denies head trauma or LOC.  CT head without acute finding.  No focal neurodeficit other than right arm weakness likely from rotator cuff issue.  She had SIRS with lactic acidosis likely from dehydration versus infectious process.  UDS, TSH, EtOH and salicylate level within normal. -Psychiatry consulted -Defer psychoactive meds to psychiatry -Discontinue CIWA-Per patient's father, "she does not have a taste for alcohol" -Follow urine pregnancy test.   SIRS/lactic acidosis: Likely from  dehydration.  Sepsis ruled out. -Change IV NS to half NS given hypernatremia.   Polycythemia and thrombocytosis-likely from hemoconcentration in setting of dehydration.  Resolved.   Uncontrolled hypertension/hypertensive urgency: BP improved.  Not on antihypertensive meds at home. -Hydralazine as needed  Mildly elevated troponin: Likely demand ischemia.  No chest pain. -No indication for further work-up.   Hypokalemia: K3.4. -P.o. KCl 40x2 -Check  magnesium  Right shoulder pain/history of rotator cuff injury-chronic per patient's mother.  X-ray without osseous finding.  -Check MRI shoulder   Chronic back pain/vertebral DDD-patient is on Subutex at home. -Started on Norco here -We will transition back to Subutex   History of admission for stridor secondary to paroxysmal vocal cord closure 2017 -No respiratory issue.  Leukocytosis/bandemia: Likely demargination in the setting of dehydration.  Resolving.  Body mass index is 22.78 kg/m.         DVT prophylaxis:  enoxaparin (LOVENOX) injection 40 mg Start: 06/21/21 2245  Code Status: Full code Family Communication: Updated patient's father with patient's permission Level of care: Progressive Status is: Inpatient  Remains inpatient appropriate because: Acute encephalopathy/delusion, hypernatremia and further evaluation for right shoulder pain       Consultants:  Psychiatry   Sch Meds:  Scheduled Meds:  enoxaparin (LOVENOX) injection  40 mg Subcutaneous Q24H   LORazepam  0-4 mg Intravenous Q6H   Or   LORazepam  0-4 mg Oral Q6H   [START ON 06/24/2021] LORazepam  0-4 mg Intravenous Q12H   Or   [START ON 06/24/2021] LORazepam  0-4 mg Oral Q12H   thiamine  100 mg Oral Daily   Or   thiamine  100 mg Intravenous Daily   Continuous Infusions:  sodium chloride 100 mL/hr at 06/22/21 1358   PRN Meds:.acetaminophen **OR** acetaminophen, HYDROcodone-acetaminophen, ondansetron **OR** ondansetron (ZOFRAN) IV  Antimicrobials: Anti-infectives (From admission, onward)    None        I have personally reviewed the following labs and images: CBC: Recent Labs  Lab 06/21/21 1930 06/22/21 0539  WBC 14.6* 11.2*  NEUTROABS 11.5*  --   HGB 17.7* 13.8  HCT 50.3* 41.2  MCV 84.0 87.1  PLT 439* 289   BMP &GFR Recent Labs  Lab 06/21/21 1930 06/22/21 0539  NA 141 146*  K 3.0* 3.4*  CL 104 109  CO2 23 27  GLUCOSE 167* 101*  BUN 45* 25*  CREATININE 0.83 0.63   CALCIUM 9.5 8.8*   Estimated Creatinine Clearance: 87.5 mL/min (by C-G formula based on SCr of 0.63 mg/dL). Liver & Pancreas: Recent Labs  Lab 06/21/21 1930 06/22/21 0539  AST 23 19  ALT 12 9  ALKPHOS 66 57  BILITOT 1.1 1.1  PROT 8.7* 6.7  ALBUMIN 4.5 3.5   No results for input(s): LIPASE, AMYLASE in the last 168 hours. No results for input(s): AMMONIA in the last 168 hours. Diabetic: No results for input(s): HGBA1C in the last 72 hours. No results for input(s): GLUCAP in the last 168 hours. Cardiac Enzymes: No results for input(s): CKTOTAL, CKMB, CKMBINDEX, TROPONINI in the last 168 hours. No results for input(s): PROBNP in the last 8760 hours. Coagulation Profile: No results for input(s): INR, PROTIME in the last 168 hours. Thyroid Function Tests: Recent Labs    06/21/21 1930  TSH 1.002   Lipid Profile: No results for input(s): CHOL, HDL, LDLCALC, TRIG, CHOLHDL, LDLDIRECT in the last 72 hours. Anemia Panel: No results for input(s): VITAMINB12, FOLATE, FERRITIN, TIBC, IRON, RETICCTPCT in the last 72 hours. Urine  analysis:    Component Value Date/Time   COLORURINE YELLOW (A) 06/21/2021 2145   APPEARANCEUR HAZY (A) 06/21/2021 2145   APPEARANCEUR Clear 02/20/2012 0840   LABSPEC 1.024 06/21/2021 2145   LABSPEC 1.011 02/20/2012 0840   PHURINE 5.0 06/21/2021 2145   GLUCOSEU 50 (A) 06/21/2021 2145   GLUCOSEU Negative 02/20/2012 0840   HGBUR SMALL (A) 06/21/2021 2145   BILIRUBINUR NEGATIVE 06/21/2021 2145   BILIRUBINUR Negative 02/20/2012 0840   KETONESUR 20 (A) 06/21/2021 2145   PROTEINUR >=300 (A) 06/21/2021 2145   NITRITE NEGATIVE 06/21/2021 2145   LEUKOCYTESUR NEGATIVE 06/21/2021 2145   LEUKOCYTESUR Negative 02/20/2012 0840   Sepsis Labs: Invalid input(s): PROCALCITONIN, [place]  Microbiology: Recent Results (from the past 240 hour(s))  Resp Panel by RT-PCR (Flu A&B, Covid) Nasopharyngeal Swab     Status: None   Collection Time: 06/21/21  7:30 PM    Specimen: Nasopharyngeal Swab; Nasopharyngeal(NP) swabs in vial transport medium  Result Value Ref Range Status   SARS Coronavirus 2 by RT PCR NEGATIVE NEGATIVE Final    Comment: (NOTE) SARS-CoV-2 target nucleic acids are NOT DETECTED.  The SARS-CoV-2 RNA is generally detectable in upper respiratory specimens during the acute phase of infection. The lowest concentration of SARS-CoV-2 viral copies this assay can detect is 138 copies/mL. A negative result does not preclude SARS-Cov-2 infection and should not be used as the sole basis for treatment or other patient management decisions. A negative result may occur with  improper specimen collection/handling, submission of specimen other than nasopharyngeal swab, presence of viral mutation(s) within the areas targeted by this assay, and inadequate number of viral copies(<138 copies/mL). A negative result must be combined with clinical observations, patient history, and epidemiological information. The expected result is Negative.  Fact Sheet for Patients:  EntrepreneurPulse.com.au  Fact Sheet for Healthcare Providers:  IncredibleEmployment.be  This test is no t yet approved or cleared by the [place] FDA and  has been authorized for detection and/or diagnosis of SARS-CoV-2 by FDA under an Emergency Use Authorization (EUA). This EUA will remain  in effect (meaning this test can be used) for the duration of the COVID-19 declaration under Section 564(b)(1) of the Act, 21 U.S.C.section 360bbb-3(b)(1), unless the authorization is terminated  or revoked sooner.       Influenza A by PCR NEGATIVE NEGATIVE Final   Influenza B by PCR NEGATIVE NEGATIVE Final    Comment: (NOTE) The Xpert Xpress SARS-CoV-2/FLU/RSV plus assay is intended as an aid in the diagnosis of influenza from Nasopharyngeal swab specimens and should not be used as a sole basis for treatment. Nasal washings and aspirates are  unacceptable for Xpert Xpress SARS-CoV-2/FLU/RSV testing.  Fact Sheet for Patients: EntrepreneurPulse.com.au  Fact Sheet for Healthcare Providers: IncredibleEmployment.be  This test is not yet approved or cleared by the [place] FDA and has been authorized for detection and/or diagnosis of SARS-CoV-2 by FDA under an Emergency Use Authorization (EUA). This EUA will remain in effect (meaning this test can be used) for the duration of the COVID-19 declaration under Section 564(b)(1) of the Act, 21 U.S.C. section 360bbb-3(b)(1), unless the authorization is terminated or revoked.  Performed at [facility], [address], [place], [place] [address]     Radiology Studies: DG Shoulder Right  Result Date: 06/21/2021 CLINICAL DATA:  Fall, right shoulder pain EXAM: RIGHT SHOULDER - 2+ VIEW COMPARISON:  09/18/2015 FINDINGS: There is no evidence of fracture or dislocation. Focal lucency within the a proximal metaphyseal region of the right  humerus is in keeping with prior biceps tendon repair. There is no evidence of arthropathy or other focal bone abnormality. Soft tissues are unremarkable. IMPRESSION: Negative. Electronically Signed   By: Fidela Salisbury M.D.   On: 06/21/2021 20:19   CT Head Wo Contrast  Result Date: 06/21/2021 CLINICAL DATA:  Altered mental status EXAM: CT HEAD WITHOUT CONTRAST TECHNIQUE: Contiguous axial images were obtained from the base of the skull through the vertex without intravenous contrast. RADIATION DOSE REDUCTION: This exam was performed according to the departmental dose-optimization program which includes automated exposure control, adjustment of the mA and/or kV according to patient size and/or use of iterative reconstruction technique. COMPARISON:  08/30/2020 FINDINGS: Brain: Normal anatomic configuration. No abnormal intra or extra-axial mass lesion or fluid collection. No abnormal mass effect or midline shift. No  evidence of acute intracranial hemorrhage or infarct. Ventricular size is normal. Cerebellum unremarkable. Vascular: Unremarkable Skull: Intact Sinuses/Orbits: Paranasal sinuses are clear. Orbits are unremarkable. Other: Mastoid air cells and middle ear cavities are clear. IMPRESSION: No acute intracranial abnormality.  Normal examination. Electronically Signed   By: Fidela Salisbury M.D.   On: 06/21/2021 20:38   CT Angio Chest Pulmonary Embolism (PE) W or WO Contrast  Result Date: 06/21/2021 CLINICAL DATA:  Pulmonary embolism (PE) suspected, unknown D-dimer EXAM: CT ANGIOGRAPHY CHEST WITH CONTRAST TECHNIQUE: Multidetector CT imaging of the chest was performed using the standard protocol during bolus administration of intravenous contrast. Multiplanar CT image reconstructions and MIPs were obtained to evaluate the vascular anatomy. RADIATION DOSE REDUCTION: This exam was performed according to the departmental dose-optimization program which includes automated exposure control, adjustment of the mA and/or kV according to patient size and/or use of iterative reconstruction technique. CONTRAST:  54mL OMNIPAQUE IOHEXOL 350 MG/ML SOLN COMPARISON:  None. FINDINGS: Cardiovascular: Satisfactory opacification of the pulmonary arteries to the segmental level. No evidence of pulmonary embolism. Normal heart size. No pericardial effusion. Mediastinum/Nodes: No enlarged mediastinal, hilar, or axillary lymph nodes. Thyroid gland, trachea, and esophagus demonstrate no significant findings. Lungs/Pleura: Lungs are clear. No pleural effusion or pneumothorax. Upper Abdomen: No acute abnormality. Musculoskeletal: No chest wall abnormality. No acute or significant osseous findings. Review of the MIP images confirms the above findings. IMPRESSION: No pulmonary embolism.  No acute intrathoracic pathology identified. Electronically Signed   By: Fidela Salisbury M.D.   On: 06/21/2021 23:42   DG Chest Portable 1 View  Result Date:  06/21/2021 CLINICAL DATA:  Fall, chest pain EXAM: PORTABLE CHEST 1 VIEW COMPARISON:  08/30/2020 FINDINGS: The heart size and mediastinal contours are within normal limits. Both lungs are clear. The visualized skeletal structures are unremarkable. IMPRESSION: No active disease. Electronically Signed   By: Fidela Salisbury M.D.   On: 06/21/2021 20:18      Massie Cogliano T. [place]  If 7PM-7AM, please contact night-coverage [email or web address] 06/22/2021, 2:01 PM

## 2021-06-22 NOTE — Progress Notes (Signed)
Patient initially came to the floor confused; some words incomprehensible, responds to name when called but unable to state name, location,situation, time. After about an hour, patient is more clear, speech comprehensible.

## 2021-06-23 DIAGNOSIS — G9341 Metabolic encephalopathy: Secondary | ICD-10-CM

## 2021-06-23 DIAGNOSIS — F900 Attention-deficit hyperactivity disorder, predominantly inattentive type: Secondary | ICD-10-CM

## 2021-06-23 DIAGNOSIS — F19950 Other psychoactive substance use, unspecified with psychoactive substance-induced psychotic disorder with delusions: Secondary | ICD-10-CM | POA: Diagnosis present

## 2021-06-23 LAB — CK: Total CK: 174 U/L (ref 38–234)

## 2021-06-23 LAB — RENAL FUNCTION PANEL
Albumin: 3.1 g/dL — ABNORMAL LOW (ref 3.5–5.0)
Anion gap: 10 (ref 5–15)
BUN: 9 mg/dL (ref 6–20)
CO2: 26 mmol/L (ref 22–32)
Calcium: 8.4 mg/dL — ABNORMAL LOW (ref 8.9–10.3)
Chloride: 101 mmol/L (ref 98–111)
Creatinine, Ser: 0.38 mg/dL — ABNORMAL LOW (ref 0.44–1.00)
GFR, Estimated: >60 mL/min (ref >60)
Glucose, Bld: 90 mg/dL (ref 70–99)
Phosphorus: 2.7 mg/dL (ref 2.5–4.6)
Potassium: 3.4 mmol/L — ABNORMAL LOW (ref 3.5–5.1)
Sodium: 137 mmol/L (ref 135–145)

## 2021-06-23 LAB — CBC
HCT: 36.9 % (ref 36.0–46.0)
Hemoglobin: 12.5 g/dL (ref 12.0–15.0)
MCH: 28.9 pg (ref 26.0–34.0)
MCHC: 33.9 g/dL (ref 30.0–36.0)
MCV: 85.2 fL (ref 80.0–100.0)
Platelets: 241 10*3/uL (ref 150–400)
RBC: 4.33 MIL/uL (ref 3.87–5.11)
RDW: 12.6 % (ref 11.5–15.5)
WBC: 6.5 10*3/uL (ref 4.0–10.5)
nRBC: 0 % (ref 0.0–0.2)

## 2021-06-23 LAB — VITAMIN B12: Vitamin B-12: 201 pg/mL (ref 180–914)

## 2021-06-23 LAB — MAGNESIUM: Magnesium: 1.9 mg/dL (ref 1.7–2.4)

## 2021-06-23 MED ORDER — POTASSIUM CHLORIDE CRYS ER 20 MEQ PO TBCR
40.0000 meq | EXTENDED_RELEASE_TABLET | ORAL | Status: AC
  Administered 2021-06-23 (×2): 40 meq via ORAL
  Filled 2021-06-23 (×2): qty 2

## 2021-06-23 NOTE — Consult Note (Addendum)
[facility] Consult   Reason for Consult:  delusions  Referring Physician:  Dr Cyndia Skeeters Patient Identification: Summer Buckley MRN:  [id number] Principal Diagnosis: Acute metabolic encephalopathy Diagnosis:  Principal Problem:   Acute metabolic encephalopathy Active Problems:   Substance-induced psychotic disorder with delusions (HCC)   Chronic back pain   Hypertensive urgency   Sinus tachycardia   SIRS (systemic inflammatory response syndrome) (HCC)   Lactic acidosis   ADHD (attention deficit hyperactivity disorder)   Anxiety and depression   Hypokalemia   Polycythemia   Total Time spent with patient: 1 hour  Subjective:   Summer Buckley is a 36 y.o. female patient admitted with to [facility] for a fall. The patient reported that she has felt like she was going to pass out. She said she did and must have hurt her shoulder so bad and she was brought to the hospital. The patient current depression is 3/10 with 10 being the highest, anxiety 4/10. She reported she sleep good is good with 6-8 hours and her appetite is improving.  She reported that she has an outpatient provider Dr. Kasandra Knudsen who prescribes her medications. Patient denies alcohol consumption, tobacco use and illicit drug use.  Dr Kasandra Knudsen prescribes Suboxone 8/2 TID, Xanax 2 mg TID, and Adderall 30 mg BID.  It appears she is over medicated which is triggering falls at home per her chart review.  Recommend to not return to using benzodiazepines after discharge and reduce Adderall.  Denies suicidal/homicidal ideations, hallucinations, or psychosis.  Past Psychiatric History: depression, ADHD, anxiety  Risk to Self:  none Risk to Others:  none Prior Inpatient Therapy:  denies Prior Outpatient Therapy:  Dr Kasandra Knudsen  Past Medical History:  Past Medical History:  Diagnosis Date   ADHD (attention deficit hyperactivity disorder)    Anxiety    Arthritis    Bruises easily    Bulging lumbar disc     Chronic back pain    Depression    Endometriosis    Fibromyalgia    GERD (gastroesophageal reflux disease)    OCC-NO MEDS   Headache    History of methicillin resistant staphylococcus aureus (MRSA) 06/2015   + PCR AND MRSA ON NECK   PTSD (post-traumatic stress disorder)    Throat injury    PT STATES THAT SHE WAS STRANGLED YEARS AGO AND NOW HAS PROBLEMS WITH HER THROAT SWELLING-NORMALLY HAPPENS ONCE YEARLY -LAST HAPPENED IN JULY 2017    Past Surgical History:  Procedure Laterality Date   ABDOMINAL HYSTERECTOMY     APPENDECTOMY     BACK SURGERY     EXPLORATORY LAPAROTOMY     SHOULDER ARTHROSCOPY WITH BICEPS TENDON REPAIR Right 05/29/2016   Procedure: SHOULDER ARTHROSCOPY WITH MINI OPEN BICEPS TENDON REPAIR;  Surgeon: Corky Mull, MD;  Location: [facility] ORS;  Service: Orthopedics;  Laterality: Right;   SHOULDER ARTHROSCOPY WITH SUBACROMIAL DECOMPRESSION Right 05/29/2016   Procedure: SHOULDER ARTHROSCOPY WITH SUBACROMIAL DECOMPRESSION AND DEBRIDEMENT;  Surgeon: Corky Mull, MD;  Location: [facility] ORS;  Service: Orthopedics;  Laterality: Right;   TONSILLECTOMY     Family History:  Family History  Problem Relation Age of Onset   Hypertension Mother    Hyperlipidemia Mother    Hypertension Father    Hyperlipidemia Father    Diabetes Father    Family Psychiatric  History: none Social History:  Social History   Substance and Sexual Activity  Alcohol Use No     Social History   Substance and Sexual  Activity  Drug Use No    Social History   Socioeconomic History   Marital status: Single    Spouse name: Not on file   Number of children: Not on file   Years of education: Not on file   Highest education level: Not on file  Occupational History   Not on file  Tobacco Use   Smoking status: Never   Smokeless tobacco: Never  Vaping Use   Vaping Use: Never used  Substance and Sexual Activity   Alcohol use: No   Drug use: No   Sexual activity: Yes    Birth control/protection:  Surgical  Other Topics Concern   Not on file  Social History Narrative   Not on file   Social Determinants of Health   Financial Resource Strain: Not on file  Food Insecurity: Not on file  Transportation Needs: Not on file  Physical Activity: Not on file  Stress: Not on file  Social Connections: Not on file   Additional Social History:    Allergies:   Allergies  Allergen Reactions   Tape Itching and Rash    TEGADERM    Labs:  Results for orders placed or performed during the hospital encounter of 06/21/21 (from the past 48 hour(s))  Acetaminophen level     Status: Abnormal   Collection Time: 06/21/21  7:30 PM  Result Value Ref Range   Acetaminophen (Tylenol), Serum <10 (L) 10 - 30 ug/mL    Comment: (NOTE) Therapeutic concentrations vary significantly. A range of 10-30 ug/mL  may be an effective concentration for many patients. However, some  are best treated at concentrations outside of this range. Acetaminophen concentrations >150 ug/mL at 4 hours after ingestion  and >50 ug/mL at 12 hours after ingestion are often associated with  toxic reactions.  Performed at [facility], [address] [place], [place] [address]   Basic metabolic panel     Status: Abnormal   Collection Time: 06/21/21  7:30 PM  Result Value Ref Range   Sodium 141 135 - 145 mmol/L   Potassium 3.0 (L) 3.5 - 5.1 mmol/L   Chloride 104 98 - 111 mmol/L   CO2 23 22 - 32 mmol/L   Glucose, Bld 167 (H) 70 - 99 mg/dL    Comment: Glucose reference range applies only to samples taken after fasting for at least 8 hours.   BUN 45 (H) 6 - 20 mg/dL   Creatinine, Ser 0.83 0.44 - 1.00 mg/dL   Calcium 9.5 8.9 - 10.3 mg/dL   GFR, Estimated >60 >60 mL/min    Comment: (NOTE) Calculated using the CKD-EPI Creatinine Equation (2021)    Anion gap 14 5 - 15    Comment: Performed at [facility], [address] [place], [place] [address]  Hepatic function panel     Status: Abnormal    Collection Time: 06/21/21  7:30 PM  Result Value Ref Range   Total Protein 8.7 (H) 6.5 - 8.1 g/dL   Albumin 4.5 3.5 - 5.0 g/dL   AST 23 15 - 41 U/L   ALT 12 0 - 44 U/L   Alkaline Phosphatase 66 38 - 126 U/L   Total Bilirubin 1.1 0.3 - 1.2 mg/dL   Bilirubin, Direct 0.2 0.0 - 0.2 mg/dL   Indirect Bilirubin 0.9 0.3 - 0.9 mg/dL    Comment: Performed at [facility], [address], [place], [place] [address]  Ethanol     Status: None   Collection Time:  06/21/21  7:30 PM  Result Value Ref Range   Alcohol, Ethyl (B) <10 <10 mg/dL    Comment: (NOTE) Lowest detectable limit for serum alcohol is 10 mg/dL.  For medical purposes only. Performed at [facility], [address] [place], [place] [address]   Lactic acid, plasma     Status: Abnormal   Collection Time: 06/21/21  7:30 PM  Result Value Ref Range   Lactic Acid, Venous 2.4 (HH) 0.5 - 1.9 mmol/L    Comment: CRITICAL RESULT CALLED TO, READ BACK BY AND VERIFIED WITH RN HEATHER LEE AT 2041 06/21/21 GAA Performed at [facility], [address] [place], [place] [address]   Salicylate level     Status: Abnormal   Collection Time: 06/21/21  7:30 PM  Result Value Ref Range   Salicylate Lvl <2.6 (L) 7.0 - 30.0 mg/dL    Comment: Performed at [facility], [place], [place] [address]  Troponin I (High Sensitivity)     Status: Abnormal   Collection Time: 06/21/21  7:30 PM  Result Value Ref Range   Troponin I (High Sensitivity) 26 (H) <18 ng/L    Comment: (NOTE) Elevated high sensitivity troponin I (hsTnI) values and significant  changes across serial measurements may suggest ACS but many other  chronic and acute conditions are known to elevate hsTnI results.  Refer to the "Links" section for chest pain algorithms and additional  guidance. Performed at [facility], [address] [place], [place] [address]   CBC with Differential     Status: Abnormal   Collection  Time: 06/21/21  7:30 PM  Result Value Ref Range   WBC 14.6 (H) 4.0 - 10.5 K/uL   RBC 5.99 (H) 3.87 - 5.11 MIL/uL   Hemoglobin 17.7 (H) 12.0 - 15.0 g/dL   HCT 50.3 (H) 36.0 - 46.0 %   MCV 84.0 80.0 - 100.0 fL   MCH 29.5 26.0 - 34.0 pg   MCHC 35.2 30.0 - 36.0 g/dL   RDW 12.7 11.5 - 15.5 %   Platelets 439 (H) 150 - 400 K/uL   nRBC 0.0 0.0 - 0.2 %   Neutrophils Relative % 78 %   Neutro Abs 11.5 (H) 1.7 - 7.7 K/uL   Lymphocytes Relative 12 %   Lymphs Abs 1.7 0.7 - 4.0 K/uL   Monocytes Relative 9 %   Monocytes Absolute 1.3 (H) 0.1 - 1.0 K/uL   Eosinophils Relative 0 %   Eosinophils Absolute 0.0 0.0 - 0.5 K/uL   Basophils Relative 0 %   Basophils Absolute 0.0 0.0 - 0.1 K/uL   Immature Granulocytes 1 %   Abs Immature Granulocytes 0.07 0.00 - 0.07 K/uL    Comment: Performed at [facility], [address], [place], [place] [address]  Resp Panel by RT-PCR (Flu A&B, Covid) Nasopharyngeal Swab     Status: None   Collection Time: 06/21/21  7:30 PM   Specimen: Nasopharyngeal Swab; Nasopharyngeal(NP) swabs in vial transport medium  Result Value Ref Range   SARS Coronavirus 2 by RT PCR NEGATIVE NEGATIVE    Comment: (NOTE) SARS-CoV-2 target nucleic acids are NOT DETECTED.  The SARS-CoV-2 RNA is generally detectable in upper respiratory specimens during the acute phase of infection. The lowest concentration of SARS-CoV-2 viral copies this assay can detect is 138 copies/mL. A negative result does not preclude SARS-Cov-2 infection and should not be used as the sole basis for treatment or other patient management decisions. A negative  result may occur with  improper specimen collection/handling, submission of specimen other than nasopharyngeal swab, presence of viral mutation(s) within the areas targeted by this assay, and inadequate number of viral copies(<138 copies/mL). A negative result must be combined with clinical observations, patient history, and epidemiological information.  The expected result is Negative.  Fact Sheet for Patients:  EntrepreneurPulse.com.au  Fact Sheet for Healthcare Providers:  IncredibleEmployment.be  This test is no t yet approved or cleared by the [place] FDA and  has been authorized for detection and/or diagnosis of SARS-CoV-2 by FDA under an Emergency Use Authorization (EUA). This EUA will remain  in effect (meaning this test can be used) for the duration of the COVID-19 declaration under Section 564(b)(1) of the Act, 21 U.S.C.section 360bbb-3(b)(1), unless the authorization is terminated  or revoked sooner.       Influenza A by PCR NEGATIVE NEGATIVE   Influenza B by PCR NEGATIVE NEGATIVE    Comment: (NOTE) The Xpert Xpress SARS-CoV-2/FLU/RSV plus assay is intended as an aid in the diagnosis of influenza from Nasopharyngeal swab specimens and should not be used as a sole basis for treatment. Nasal washings and aspirates are unacceptable for Xpert Xpress SARS-CoV-2/FLU/RSV testing.  Fact Sheet for Patients: EntrepreneurPulse.com.au  Fact Sheet for Healthcare Providers: IncredibleEmployment.be  This test is not yet approved or cleared by the [place] FDA and has been authorized for detection and/or diagnosis of SARS-CoV-2 by FDA under an Emergency Use Authorization (EUA). This EUA will remain in effect (meaning this test can be used) for the duration of the COVID-19 declaration under Section 564(b)(1) of the Act, 21 U.S.C. section 360bbb-3(b)(1), unless the authorization is terminated or revoked.  Performed at [facility], [address] [place], [place] [address]   TSH     Status: None   Collection Time: 06/21/21  7:30 PM  Result Value Ref Range   TSH 1.002 0.350 - 4.500 uIU/mL    Comment: Performed by a 3rd Generation assay with a functional sensitivity of <=0.01 uIU/mL. Performed at [facility], [address] [place], [place] [address]   Urinalysis, Routine w reflex microscopic Urine, Catheterized     Status: Abnormal   Collection Time: 06/21/21  9:45 PM  Result Value Ref Range   Color, Urine YELLOW (A) YELLOW   APPearance HAZY (A) CLEAR   Specific Gravity, Urine 1.024 1.005 - 1.030   pH 5.0 5.0 - 8.0   Glucose, UA 50 (A) NEGATIVE mg/dL   Hgb urine dipstick SMALL (A) NEGATIVE   Bilirubin Urine NEGATIVE NEGATIVE   Ketones, ur 20 (A) NEGATIVE mg/dL   Protein, ur >=300 (A) NEGATIVE mg/dL   Nitrite NEGATIVE NEGATIVE   Leukocytes,Ua NEGATIVE NEGATIVE   RBC / HPF 0-5 0 - 5 RBC/hpf   WBC, UA 0-5 0 - 5 WBC/hpf   Bacteria, UA RARE (A) NONE SEEN   Squamous Epithelial / LPF 0-5 0 - 5   Mucus PRESENT    Hyaline Casts, UA PRESENT     Comment: Performed at [facility], [address] [place],  [address]  Urine Drug Screen, Qualitative     Status: None   Collection Time: 06/21/21  9:45 PM  Result Value Ref Range   Tricyclic, Ur Screen NONE DETECTED NONE DETECTED   Amphetamines, Ur Screen NONE DETECTED NONE DETECTED   MDMA (Ecstasy)Ur Screen NONE DETECTED NONE DETECTED   Cocaine Metabolite,Ur [place] NONE DETECTED NONE DETECTED   Opiate, Ur Screen NONE DETECTED NONE DETECTED  Phencyclidine (PCP) Ur S NONE DETECTED NONE DETECTED   Cannabinoid 50 Ng, Ur [place] NONE DETECTED NONE DETECTED   Barbiturates, Ur Screen NONE DETECTED NONE DETECTED   Benzodiazepine, Ur Scrn NONE DETECTED NONE DETECTED   Methadone Scn, Ur NONE DETECTED NONE DETECTED    Comment: (NOTE) Tricyclics + metabolites, urine    Cutoff 1000 ng/mL Amphetamines + metabolites, urine  Cutoff 1000 ng/mL MDMA (Ecstasy), urine              Cutoff 500 ng/mL Cocaine Metabolite, urine          Cutoff 300 ng/mL Opiate + metabolites, urine        Cutoff 300 ng/mL Phencyclidine (PCP), urine         Cutoff 25 ng/mL Cannabinoid, urine                 Cutoff 50 ng/mL Barbiturates + metabolites, urine  Cutoff 200 ng/mL Benzodiazepine,  urine              Cutoff 200 ng/mL Methadone, urine                   Cutoff 300 ng/mL  The urine drug screen provides only a preliminary, unconfirmed analytical test result and should not be used for non-medical purposes. Clinical consideration and professional judgment should be applied to any positive drug screen result due to possible interfering substances. A more specific alternate chemical method must be used in order to obtain a confirmed analytical result. Gas chromatography / mass spectrometry (GC/MS) is the preferred confirm atory method. Performed at [facility], [address] [place], [place] [address]   Lactic acid, plasma     Status: Abnormal   Collection Time: 06/21/21 11:59 PM  Result Value Ref Range   Lactic Acid, Venous 2.7 (HH) 0.5 - 1.9 mmol/L    Comment: CRITICAL RESULT CALLED TO, READ BACK BY AND VERIFIED WITH Renaldo Reel RN AT 06/22/2021 GA Performed at [facility], [address], [place], [place] [address]   Troponin I (High Sensitivity)     Status: Abnormal   Collection Time: 06/21/21 11:59 PM  Result Value Ref Range   Troponin I (High Sensitivity) 33 (H) <18 ng/L    Comment: (NOTE) Elevated high sensitivity troponin I (hsTnI) values and significant  changes across serial measurements may suggest ACS but many other  chronic and acute conditions are known to elevate hsTnI results.  Refer to the "Links" section for chest pain algorithms and additional  guidance. Performed at [facility], [address] [place], [place] [address]   HIV Antibody (routine testing w rflx)     Status: None   Collection Time: 06/21/21 11:59 PM  Result Value Ref Range   HIV Screen 4th Generation wRfx Non Reactive Non Reactive    Comment: Performed at [facility], [place] [place], [place] [address]  CBC     Status: Abnormal   Collection Time: 06/22/21  5:39 AM  Result Value Ref Range   WBC 11.2 (H) 4.0 - 10.5 K/uL    RBC 4.73 3.87 - 5.11 MIL/uL   Hemoglobin 13.8 12.0 - 15.0 g/dL   HCT 41.2 36.0 - 46.0 %   MCV 87.1 80.0 - 100.0 fL   MCH 29.2 26.0 - 34.0 pg   MCHC 33.5 30.0 - 36.0 g/dL   RDW 12.8 11.5 - 15.5 %   Platelets 289 150 - 400 K/uL   nRBC 0.0 0.0 -  0.2 %    Comment: Performed at [facility], [address] [place], [place] [address]  Comprehensive metabolic panel     Status: Abnormal   Collection Time: 06/22/21  5:39 AM  Result Value Ref Range   Sodium 146 (H) 135 - 145 mmol/L   Potassium 3.4 (L) 3.5 - 5.1 mmol/L   Chloride 109 98 - 111 mmol/L   CO2 27 22 - 32 mmol/L   Glucose, Bld 101 (H) 70 - 99 mg/dL    Comment: Glucose reference range applies only to samples taken after fasting for at least 8 hours.   BUN 25 (H) 6 - 20 mg/dL   Creatinine, Ser 0.63 0.44 - 1.00 mg/dL   Calcium 8.8 (L) 8.9 - 10.3 mg/dL   Total Protein 6.7 6.5 - 8.1 g/dL   Albumin 3.5 3.5 - 5.0 g/dL   AST 19 15 - 41 U/L   ALT 9 0 - 44 U/L   Alkaline Phosphatase 57 38 - 126 U/L   Total Bilirubin 1.1 0.3 - 1.2 mg/dL   GFR, Estimated >60 >60 mL/min    Comment: (NOTE) Calculated using the CKD-EPI Creatinine Equation (2021)    Anion gap 10 5 - 15    Comment: Performed at [facility], [address] [place], [place] [address]  Lactic acid, plasma     Status: None   Collection Time: 06/22/21  5:39 AM  Result Value Ref Range   Lactic Acid, Venous 1.3 0.5 - 1.9 mmol/L    Comment: Performed at [facility], [address] [place], [place] [address]  Magnesium     Status: None   Collection Time: 06/22/21  5:39 AM  Result Value Ref Range   Magnesium 2.4 1.7 - 2.4 mg/dL    Comment: Performed at [facility], [address] [place], [place] [address]  Pregnancy, urine     Status: None   Collection Time: 06/22/21  4:30 PM  Result Value Ref Range   Preg Test, Ur NEGATIVE NEGATIVE    Comment: Performed at [facility], [address] [place], [place] [address]   Magnesium     Status: None   Collection Time: 06/23/21  6:47 AM  Result Value Ref Range   Magnesium 1.9 1.7 - 2.4 mg/dL    Comment: Performed at [facility], [address] [place], [place] [address]  Renal function panel     Status: Abnormal   Collection Time: 06/23/21  6:47 AM  Result Value Ref Range   Sodium 137 135 - 145 mmol/L   Potassium 3.4 (L) 3.5 - 5.1 mmol/L   Chloride 101 98 - 111 mmol/L   CO2 26 22 - 32 mmol/L   Glucose, Bld 90 70 - 99 mg/dL    Comment: Glucose reference range applies only to samples taken after fasting for at least 8 hours.   BUN 9 6 - 20 mg/dL   Creatinine, Ser 0.38 (L) 0.44 - 1.00 mg/dL   Calcium 8.4 (L) 8.9 - 10.3 mg/dL   Phosphorus 2.7 2.5 - 4.6 mg/dL   Albumin 3.1 (L) 3.5 - 5.0 g/dL   GFR, Estimated >60 >60 mL/min    Comment: (NOTE) Calculated using the CKD-EPI Creatinine Equation (2021)    Anion gap 10 5 - 15    Comment: Performed at [facility], [address] [place],  [address]  CBC     Status: None   Collection Time: 06/23/21  6:47 AM  Result Value Ref Range   WBC 6.5 4.0 - 10.5 K/uL   RBC 4.33 3.87 - 5.11 MIL/uL   Hemoglobin 12.5 12.0 - 15.0 g/dL   HCT 36.9 36.0 - 46.0 %   MCV 85.2 80.0 - 100.0 fL   MCH 28.9 26.0 - 34.0 pg   MCHC 33.9 30.0 - 36.0 g/dL   RDW 12.6 11.5 - 15.5 %   Platelets 241 150 - 400 K/uL   nRBC 0.0 0.0 - 0.2 %    Comment: Performed at [facility], [address] [place], [place] [address]  CK     Status: None   Collection Time: 06/23/21  6:47 AM  Result Value Ref Range   Total CK 174 38 - 234 U/L    Comment: Performed at [facility], [address] [place], [place] [address]  Vitamin B12     Status: None   Collection Time: 06/23/21  6:47 AM  Result Value Ref Range   Vitamin B-12 201 180 - 914 pg/mL    Comment: (NOTE) This assay is not validated for testing neonatal or myeloproliferative syndrome specimens for Vitamin B12 levels. Performed at [facility], [place] [address], [place], [place] [address]     Current Facility-Administered Medications  Medication Dose Route Frequency Provider Last Rate Last Admin   acetaminophen (TYLENOL) tablet 650 mg  650 mg Oral Q6H PRN Athena Masse, MD       Or   acetaminophen (TYLENOL) suppository 650 mg  650 mg Rectal Q6H PRN Athena Masse, MD       enoxaparin (LOVENOX) injection 40 mg  40 mg Subcutaneous Q24H Judd Gaudier V, MD   40 mg at 06/22/21 1956   hydrALAZINE (APRESOLINE) tablet 25 mg  25 mg Oral Q6H PRN Mercy Riding, MD       HYDROcodone-acetaminophen (NORCO/VICODIN) 5-325 MG per tablet 1 tablet  1 tablet Oral Q6H PRN Mercy Riding, MD   1 tablet at 06/23/21 1447   [START ON 06/24/2021] LORazepam (ATIVAN) injection 0-4 mg  0-4 mg Intravenous Q12H Arta Silence, MD       LORazepam (ATIVAN) injection 0.5 mg  0.5 mg Intravenous Q6H PRN Wendee Beavers T, MD       multivitamins with iron tablet 1 tablet  1 tablet Oral Daily Wendee Beavers T, MD   1 tablet at 06/23/21 1018   ondansetron (ZOFRAN) tablet 4 mg  4 mg Oral Q6H PRN Athena Masse, MD       Or   ondansetron [facility]) injection 4 mg  4 mg Intravenous Q6H PRN Athena Masse, MD       thiamine tablet 100 mg  100 mg Oral Daily Wendee Beavers T, MD   100 mg at 06/23/21 1018    Musculoskeletal: Strength & Muscle Tone: within normal limits Gait & Station:  did not witness Patient leans: N/A  Psychiatric Specialty Exam: Physical Exam Vitals and nursing note reviewed.  Constitutional:      Appearance: Normal appearance.  HENT:     Head: Normocephalic.     Nose: Nose normal.  Pulmonary:     Effort: Pulmonary effort is normal.  Musculoskeletal:        General: Normal range of motion.     Cervical back: Normal range of motion.  Neurological:     General: No focal deficit present.     Mental Status: She is alert and oriented to person, place, and time.  Psychiatric:  Attention and Perception: Attention and perception  normal.        Mood and Affect: Mood is anxious.        Speech: Speech normal.        Behavior: Behavior normal. Behavior is cooperative.        Thought Content: Thought content normal.        Cognition and Memory: Cognition and memory normal.        Judgment: Judgment normal.    Review of Systems  Psychiatric/Behavioral:  Positive for substance abuse. The patient is nervous/anxious.   All other systems reviewed and are negative.  Blood pressure (!) 135/99, pulse 100, temperature 99 F (37.2 C), temperature source Oral, resp. rate 14, height 5\' 5"  (1.651 m), weight 62.1 kg, SpO2 98 %.Body mass index is 22.78 kg/m.  General Appearance: Casual  Eye Contact:  Good  Speech:  Normal Rate  Volume:  Normal  Mood:  Anxious and Depressed  Affect:  Appropriate  Thought Process:  Coherent and Descriptions of Associations: Intact  Orientation:  Full (Time, Place, and Person)  Thought Content:  WDL and Logical  Suicidal Thoughts:  No  Homicidal Thoughts:  No  Memory:  Immediate;   Fair Recent;   Fair Remote;   Fair  Judgement:  Fair  Insight:  Lacking  Psychomotor Activity:  Decreased  Concentration:  Concentration: Fair and Attention Span: Fair  Recall:  [employer] of Knowledge:  Fair  Language:  Good  Akathisia:  No  Handed:  Right  AIMS (if indicated):     Assets:  Housing Leisure Time Resilience Social Support  ADL's:  Intact  Cognition:  WNL  Sleep:        Physical Exam: Physical Exam Vitals and nursing note reviewed.  Constitutional:      Appearance: Normal appearance.  HENT:     Head: Normocephalic.     Nose: Nose normal.  Pulmonary:     Effort: Pulmonary effort is normal.  Musculoskeletal:        General: Normal range of motion.     Cervical back: Normal range of motion.  Neurological:     General: No focal deficit present.     Mental Status: She is alert and oriented to person, place, and time.  Psychiatric:        Attention and Perception: Attention and  perception normal.        Mood and Affect: Mood is anxious.        Speech: Speech normal.        Behavior: Behavior normal. Behavior is cooperative.        Thought Content: Thought content normal.        Cognition and Memory: Cognition and memory normal.        Judgment: Judgment normal.   Review of Systems  Psychiatric/Behavioral:  Positive for substance abuse. The patient is nervous/anxious.   All other systems reviewed and are negative. Blood pressure (!) 135/99, pulse 100, temperature 99 F (37.2 C), temperature source Oral, resp. rate 14, height 5\' 5"  (1.651 m), weight 62.1 kg, SpO2 98 %. Body mass index is 22.78 kg/m.  Treatment Plan Summary: Substance induced psychosis with delusions: -Continue Ativan detox protocol -refrain from benzodiazepine use  Disposition: No evidence of imminent risk to self or others at present.   Patient does not meet criteria for psychiatric inpatient admission.  Waylan Boga, NP 06/23/2021 4:27 PM

## 2021-06-23 NOTE — Progress Notes (Signed)
PROGRESS NOTE  Summer Buckley:[id number] DOB: 02-04-1986   PCP: [facility]  Patient is from: Home.  Lives with parents.  DOA: 06/21/2021 LOS: 2  Chief complaints:  Chief Complaint  Patient presents with   Altered Mental Status     Brief Narrative / Interim history: 36 year old F with PMH of ADHD, chronic pain on opiate, postconcussive syndrome followed by neurology outpatient, chronic rotator cuff injury of right shoulder, anxiety, depression and "paroxysmal vocal cord closure in 2017" brought to ED with altered mental status, fall in bathroom and delusion.  Per patient's father, patient has been refusing to eat and drink over the last 3 to 4 days.  Eventually she became weak and fell in the bathroom.  He does not think she hit her head.  Denies loss of consciousness.    Patient was admitted for acute metabolic encephalopathy, elevated blood pressure, SIRS and possible polycythemia with thrombocytosis.  CT head, CTA chest and CXR without acute finding.  Right shoulder x-ray without acute osseous finding.  She was started on IV fluid.   The next day, psych consulted but patient was unable to participate.  Subjective: Seen and examined earlier this morning.  No major events overnight of this morning.  Reports right shoulder pain which is chronic.  No other complaints.  She reports nausea, vomiting and diarrhea that led to dehydration.  She denies audiovisual hallucination.  No delusion today.  Objective: Vitals:   06/22/21 2304 06/23/21 0433 06/23/21 0730 06/23/21 1150  BP: (!) 116/92 (!) 133/104 (!) 133/95 (!) 135/99  Pulse: 78 78 94 100  Resp: 18 18 16 14   Temp: (!) 97.5 F (36.4 C) (!) 97.2 F (36.2 C) 99 F (37.2 C) 99 F (37.2 C)  TempSrc: Oral  Oral Oral  SpO2: 99% 100% 99% 98%  Weight:      Height:        Examination:  GENERAL: No apparent distress.  Nontoxic. HEENT: MMM.  Vision and hearing grossly intact.  NECK: Supple.  No apparent JVD.   RESP: 98% on RA.  No IWOB.  Fair aeration bilaterally. CVS:  RRR. Heart sounds normal.  ABD/GI/GU: BS+. Abd soft, NTND.  MSK/EXT:  Moves extremities.  RUE weakness SKIN: no apparent skin lesion or wound NEURO: Awake and alert. Oriented x4 except date.  No apparent focal neuro deficit. PSYCH: Calm. Normal affect.   Procedures:  None  Microbiology summarized: CHYIF-02 and influenza PCR nonreactive.  Assessment & Plan: Acute metabolic encephalopathy/delusions-patient with ADHD, anxiety, depression, PTSD and postconcussion syndrome.  Likely from dehydration.  Delusion seems to have resolved.  Work-up including CT head, UDS, TSH, EtOH and salicylate level are unrevealing. -Psychiatry consulted but patient was unable to participate on 1/21. -Defer psychoactive meds to psychiatry -Discontinued CIWA-Per patient's father, "she does not have a taste for alcohol" -PT/OT eval   SIRS/lactic acidosis: Likely from dehydration.  Sepsis ruled out. -Discontinue IV fluid.   Polycythemia and thrombocytosis-likely from hemoconcentration in setting of dehydration.  Resolved.   Uncontrolled hypertension/hypertensive urgency: BP improved.  Not on antihypertensive meds at home. -Hydralazine as needed -Discontinue IV fluid.  Mildly elevated troponin: Likely demand ischemia.  No chest pain. -No indication for further work-up.   Hypokalemia: K3.4.  Mg normal. -P.o. KCl 40x2  Right shoulder pain/history of rotator cuff injury-chronic per patient's father.  X-ray without osseous finding.  -Follow MRI shoulder   Chronic back pain/vertebral DDD-patient is on Subutex at home. -Started on Norco here -We will transition  back to Subutex   History of admission for stridor secondary to paroxysmal vocal cord closure 2017 -No respiratory issue.  Leukocytosis/bandemia: Likely demargination in the setting of dehydration.  Resolved.  Body mass index is 22.78 kg/m.         DVT prophylaxis:  enoxaparin  (LOVENOX) injection 40 mg Start: 06/21/21 2245  Code Status: Full code Family Communication: Updated patient's father on 06/22/2021.  None at bedside today Level of care: Med-Surg Status is: Inpatient  Remains inpatient appropriate because: Acute encephalopathy/delusion and evaluation by psych       Consultants:  Psychiatry   Sch Meds:  Scheduled Meds:  enoxaparin (LOVENOX) injection  40 mg Subcutaneous Q24H   [START ON 06/24/2021] LORazepam  0-4 mg Intravenous Q12H   multivitamins with iron  1 tablet Oral Daily   potassium chloride  40 mEq Oral Q3H   thiamine  100 mg Oral Daily   Continuous Infusions:   PRN Meds:.acetaminophen **OR** acetaminophen, hydrALAZINE, HYDROcodone-acetaminophen, LORazepam, ondansetron **OR** ondansetron (ZOFRAN) IV  Antimicrobials: Anti-infectives (From admission, onward)    None        I have personally reviewed the following labs and images: CBC: Recent Labs  Lab 06/21/21 1930 06/22/21 0539 06/23/21 0647  WBC 14.6* 11.2* 6.5  NEUTROABS 11.5*  --   --   HGB 17.7* 13.8 12.5  HCT 50.3* 41.2 36.9  MCV 84.0 87.1 85.2  PLT 439* 289 241   BMP &GFR Recent Labs  Lab 06/21/21 1930 06/22/21 0539 06/23/21 0647  NA 141 146* 137  K 3.0* 3.4* 3.4*  CL 104 109 101  CO2 23 27 26   GLUCOSE 167* 101* 90  BUN 45* 25* 9  CREATININE 0.83 0.63 0.38*  CALCIUM 9.5 8.8* 8.4*  MG  --  2.4 1.9  PHOS  --   --  2.7   Estimated Creatinine Clearance: 87.5 mL/min (A) (by C-G formula based on SCr of 0.38 mg/dL (L)). Liver & Pancreas: Recent Labs  Lab 06/21/21 1930 06/22/21 0539 06/23/21 0647  AST 23 19  --   ALT 12 9  --   ALKPHOS 66 57  --   BILITOT 1.1 1.1  --   PROT 8.7* 6.7  --   ALBUMIN 4.5 3.5 3.1*   No results for input(s): LIPASE, AMYLASE in the last 168 hours. No results for input(s): AMMONIA in the last 168 hours. Diabetic: No results for input(s): HGBA1C in the last 72 hours. No results for input(s): GLUCAP in the last 168  hours. Cardiac Enzymes: Recent Labs  Lab 06/23/21 0647  CKTOTAL 174   No results for input(s): PROBNP in the last 8760 hours. Coagulation Profile: No results for input(s): INR, PROTIME in the last 168 hours. Thyroid Function Tests: Recent Labs    06/21/21 1930  TSH 1.002   Lipid Profile: No results for input(s): CHOL, HDL, LDLCALC, TRIG, CHOLHDL, LDLDIRECT in the last 72 hours. Anemia Panel: No results for input(s): VITAMINB12, FOLATE, FERRITIN, TIBC, IRON, RETICCTPCT in the last 72 hours. Urine analysis:    Component Value Date/Time   COLORURINE YELLOW (A) 06/21/2021 2145   APPEARANCEUR HAZY (A) 06/21/2021 2145   APPEARANCEUR Clear 02/20/2012 0840   LABSPEC 1.024 06/21/2021 2145   LABSPEC 1.011 02/20/2012 0840   PHURINE 5.0 06/21/2021 2145   GLUCOSEU 50 (A) 06/21/2021 2145   GLUCOSEU Negative 02/20/2012 0840   HGBUR SMALL (A) 06/21/2021 2145   BILIRUBINUR NEGATIVE 06/21/2021 2145   BILIRUBINUR Negative 02/20/2012 0840   KETONESUR 20 (A) 06/21/2021  2145   PROTEINUR >=300 (A) 06/21/2021 2145   NITRITE NEGATIVE 06/21/2021 2145   LEUKOCYTESUR NEGATIVE 06/21/2021 2145   LEUKOCYTESUR Negative 02/20/2012 0840   Sepsis Labs: Invalid input(s): PROCALCITONIN, [place]  Microbiology: Recent Results (from the past 240 hour(s))  Resp Panel by RT-PCR (Flu A&B, Covid) Nasopharyngeal Swab     Status: None   Collection Time: 06/21/21  7:30 PM   Specimen: Nasopharyngeal Swab; Nasopharyngeal(NP) swabs in vial transport medium  Result Value Ref Range Status   SARS Coronavirus 2 by RT PCR NEGATIVE NEGATIVE Final    Comment: (NOTE) SARS-CoV-2 target nucleic acids are NOT DETECTED.  The SARS-CoV-2 RNA is generally detectable in upper respiratory specimens during the acute phase of infection. The lowest concentration of SARS-CoV-2 viral copies this assay can detect is 138 copies/mL. A negative result does not preclude SARS-Cov-2 infection and should not be used as the sole basis  for treatment or other patient management decisions. A negative result may occur with  improper specimen collection/handling, submission of specimen other than nasopharyngeal swab, presence of viral mutation(s) within the areas targeted by this assay, and inadequate number of viral copies(<138 copies/mL). A negative result must be combined with clinical observations, patient history, and epidemiological information. The expected result is Negative.  Fact Sheet for Patients:  EntrepreneurPulse.com.au  Fact Sheet for Healthcare Providers:  IncredibleEmployment.be  This test is no t yet approved or cleared by the [place] FDA and  has been authorized for detection and/or diagnosis of SARS-CoV-2 by FDA under an Emergency Use Authorization (EUA). This EUA will remain  in effect (meaning this test can be used) for the duration of the COVID-19 declaration under Section 564(b)(1) of the Act, 21 U.S.C.section 360bbb-3(b)(1), unless the authorization is terminated  or revoked sooner.       Influenza A by PCR NEGATIVE NEGATIVE Final   Influenza B by PCR NEGATIVE NEGATIVE Final    Comment: (NOTE) The Xpert Xpress SARS-CoV-2/FLU/RSV plus assay is intended as an aid in the diagnosis of influenza from Nasopharyngeal swab specimens and should not be used as a sole basis for treatment. Nasal washings and aspirates are unacceptable for Xpert Xpress SARS-CoV-2/FLU/RSV testing.  Fact Sheet for Patients: EntrepreneurPulse.com.au  Fact Sheet for Healthcare Providers: IncredibleEmployment.be  This test is not yet approved or cleared by the [place] FDA and has been authorized for detection and/or diagnosis of SARS-CoV-2 by FDA under an Emergency Use Authorization (EUA). This EUA will remain in effect (meaning this test can be used) for the duration of the COVID-19 declaration under Section 564(b)(1) of the Act, 21  U.S.C. section 360bbb-3(b)(1), unless the authorization is terminated or revoked.  Performed at [facility], [address] [place], [place] [address]     Radiology Studies: No results found.    Charmon Thorson T. Juni[place]  If 7PM-7AM, please contact night-coverage [email or web address] 06/23/2021, 12:45 PM

## 2021-06-23 NOTE — Evaluation (Signed)
Physical Therapy Evaluation Patient Details Name: Summer Buckley MRN: [id number] DOB: Mar 12, 1986 Today's Date: 06/23/2021  History of Present Illness  36 year old F with PMH of ADHD, chronic pain on opiate, postconcussive syndrome followed by neurology outpatient, chronic rotator cuff injury of right shoulder, anxiety, depression and "paroxysmal vocal cord closure in 2017" brought to ED with altered mental status, fall in bathroom and delusion.  Per patient's father, patient has been refusing to eat and drink over the last 3 to 4 days.  Eventually she became weak and fell in the bathroom.  He does not think she hit her head.  Denies loss of consciousness.  Clinical Impression  Pt reporting significant R shoulder pain during session limiting her progression this session. Pt presents guarding her RUE and when asked about pain began to cry, stating she has been "trying to be strong but the pain is 10/10."  RN notified of pain, pt agreeable to ambulation with PT holding RUE like a sling. At baseline she reports requiring a RW for mobility, however with RUE pain would not be able to safely navigate with RW. She would greatly benefit from OT consultation to assist with ADL assessment d/t RUE pain. PT will continue to follow for AD prescription and training, as well as gait and balance training. Recommend home with HHPT.       Recommendations for follow up therapy are one component of a multi-disciplinary discharge planning process, led by the attending physician.  Recommendations may be updated based on patient status, additional functional criteria and insurance authorization.  Follow Up Recommendations Home health PT    Assistance Recommended at Discharge Set up Supervision/Assistance  Patient can return home with the following  Assistance with cooking/housework;Help with stairs or ramp for entrance;Assist for transportation;A little help with walking and/or transfers;A little help with  bathing/dressing/bathroom    Equipment Recommendations Cane (Expected that pt will require cane for mobility d/t pain in R shoulder limiting independence with RW.)  Recommendations for Other Services  OT consult    Functional Status Assessment Patient has had a recent decline in their functional status and demonstrates the ability to make significant improvements in function in a reasonable and predictable amount of time.     Precautions / Restrictions Precautions Precautions: Fall Restrictions Weight Bearing Restrictions: No      Mobility  Bed Mobility Overal bed mobility: Modified Independent, Needs Assistance Bed Mobility: Supine to Sit     Supine to sit: HOB elevated, Modified independent (Device/Increase time)          Transfers Overall transfer level: Needs assistance Equipment used: 1 person hand held assist Transfers: Sit to/from Stand Sit to Stand: Min assist           General transfer comment: D/t significant R shoulder pain, pt requiring assistance to hold UE.    Ambulation/Gait Ambulation/Gait assistance: Min assist Gait Distance (Feet): 15 Feet Assistive device: 1 person hand held assist Gait Pattern/deviations: Drifts right/left       General Gait Details: Expected that pt will require PT education for gait with SPC vs Quad cane.  Stairs            Wheelchair Mobility    Modified Rankin (Stroke Patients Only)       Balance Overall balance assessment: History of Falls, Needs assistance Sitting-balance support: Feet supported Sitting balance-Leahy Scale: Normal     Standing balance support: Single extremity supported, During functional activity Standing balance-Leahy Scale: Fair  Pertinent Vitals/Pain Pain Assessment Pain Assessment: 0-10 Pain Score: 10-Worst pain ever Pain Location: R shoulder Pain Descriptors / Indicators: Shooting, Sore Pain Intervention(s): Patient requesting  pain meds-RN notified, Monitored during session    Home Living Family/patient expects to be discharged to:: Private residence Living Arrangements: Parent;Children Available Help at Discharge: Family;Available PRN/intermittently Type of Home: House Home Access: Stairs to enter Entrance Stairs-Rails: Can reach both Entrance Stairs-Number of Steps: 7   Home Layout: One level Home Equipment: Conservation officer, nature (2 wheels)      Prior Function Prior Level of Function : Independent/Modified Independent;History of Falls (last six months)             Mobility Comments: Pt reports some difficulty with navigating steps and navigating her tub shower.       Hand Dominance   Dominant Hand: Right    Extremity/Trunk Assessment   Upper Extremity Assessment Upper Extremity Assessment: RUE deficits/detail RUE: Unable to fully assess due to pain    Lower Extremity Assessment Lower Extremity Assessment: Generalized weakness       Communication   Communication: No difficulties  Cognition Arousal/Alertness: Awake/alert Behavior During Therapy: WFL for tasks assessed/performed Overall Cognitive Status: Within Functional Limits for tasks assessed                                          General Comments      Exercises     Assessment/Plan    PT Assessment Patient needs continued PT services  PT Problem List Decreased mobility;Decreased range of motion;Decreased balance;Decreased activity tolerance       PT Treatment Interventions Gait training;Functional mobility training;Therapeutic exercise;Balance training;Therapeutic activities;Stair training;Patient/family education    PT Goals (Current goals can be found in the Care Plan section)  Acute Rehab PT Goals Patient Stated Goal: decrease pain in R shoulder, get home to children PT Goal Formulation: With patient Time For Goal Achievement: 07/07/21 Potential to Achieve Goals: Good    Frequency        Co-evaluation               AM-PAC PT "6 Clicks" Mobility  Outcome Measure Help needed turning from your back to your side while in a flat bed without using bedrails?: A Little Help needed moving from lying on your back to sitting on the side of a flat bed without using bedrails?: A Little Help needed moving to and from a bed to a chair (including a wheelchair)?: A Little Help needed standing up from a chair using your arms (e.g., wheelchair or bedside chair)?: A Little Help needed to walk in hospital room?: A Lot Help needed climbing 3-5 steps with a railing? : A Lot 6 Click Score: 16    End of Session   Activity Tolerance: Patient limited by pain Patient left: in bed;with nursing/sitter in room;with call bell/phone within reach Nurse Communication: Mobility status;Patient requests pain meds PT Visit Diagnosis: Unsteadiness on feet (R26.81);Difficulty in walking, not elsewhere classified (R26.2);History of falling (Z91.81)    Time: 9702-6378 PT Time Calculation (min) (ACUTE ONLY): 23 min   Charges:   PT Evaluation $PT Eval Moderate Complexity: 1 Mod PT Treatments $Gait Training: 8-22 mins        3:37 PM, 06/23/21 Karelly Dewalt A. Saverio Danker PT, DPT Physical Therapist - [facility]   [place] Journey A Lequan Dobratz 06/23/2021, 3:28 PM

## 2021-06-24 DIAGNOSIS — Z9189 Other specified personal risk factors, not elsewhere classified: Secondary | ICD-10-CM

## 2021-06-24 DIAGNOSIS — M75101 Unspecified rotator cuff tear or rupture of right shoulder, not specified as traumatic: Secondary | ICD-10-CM

## 2021-06-24 LAB — RENAL FUNCTION PANEL
Albumin: 3.5 g/dL (ref 3.5–5.0)
Anion gap: 8 (ref 5–15)
BUN: 8 mg/dL (ref 6–20)
CO2: 27 mmol/L (ref 22–32)
Calcium: 8.9 mg/dL (ref 8.9–10.3)
Chloride: 102 mmol/L (ref 98–111)
Creatinine, Ser: 0.5 mg/dL (ref 0.44–1.00)
GFR, Estimated: >60 mL/min
Glucose, Bld: 97 mg/dL (ref 70–99)
Phosphorus: 3.8 mg/dL (ref 2.5–4.6)
Potassium: 3.7 mmol/L (ref 3.5–5.1)
Sodium: 137 mmol/L (ref 135–145)

## 2021-06-24 LAB — RPR: RPR Ser Ql: NONREACTIVE

## 2021-06-24 LAB — MAGNESIUM: Magnesium: 2.2 mg/dL (ref 1.7–2.4)

## 2021-06-24 MED ORDER — ALPRAZOLAM 2 MG PO TABS: 1.0000 mg | ORAL_TABLET | Freq: Three times a day (TID) | ORAL | 0 refills | Status: DC

## 2021-06-24 MED ORDER — TAB-A-VITE/IRON PO TABS: 1.0000 | ORAL_TABLET | Freq: Every day | ORAL | 0 refills | Status: AC

## 2021-06-24 NOTE — Progress Notes (Signed)
Summer Buckley to be D/C'd Home per MD order.  Discussed prescriptions and follow up appointments with the patient. Prescriptions were e-prescribed, medication list explained in detail. Pt verbalized understanding.  Allergies as of 06/24/2021       Reactions   Tape Itching, Rash   TEGADERM        Medication List     TAKE these medications    alprazolam 2 MG tablet Commonly known as: XANAX Take 0.5 tablets (1 mg total) by mouth 3 (three) times daily. What changed: how much to take   amphetamine-dextroamphetamine 15 MG 24 hr capsule Commonly known as: ADDERALL XR Take 15 mg by mouth. Take one capsule by mouth once daily in the afternoon.   amphetamine-dextroamphetamine 30 MG 24 hr capsule Commonly known as: ADDERALL XR Take 30 mg by mouth. Take one capsule by mouth once daily in the morning.   amphetamine-dextroamphetamine 30 MG tablet Commonly known as: ADDERALL Take 30 mg by mouth 2 (two) times daily. As directed   buprenorphine 8 MG Subl SL tablet Commonly known as: SUBUTEX Place 8 mg under the tongue 3 (three) times daily.   buPROPion 200 MG 12 hr tablet Commonly known as: WELLBUTRIN SR Take 200 mg by mouth every morning.   EPINEPHrine 0.3 mg/0.3 mL Soaj injection Commonly known as: EPI-PEN Inject 0.3 mg into the muscle daily as needed for anaphylaxis.   FLUoxetine 20 MG capsule Commonly known as: PROZAC Take 20 mg by mouth daily.   multivitamins with iron Tabs tablet Take 1 tablet by mouth daily.               Durable Medical Equipment  (From admission, onward)           Start     Ordered   06/24/21 0743  For home use only DME Cane  Once        06/24/21 0742            Vitals:   06/24/21 0540 06/24/21 0737  BP: (!) 124/91 (!) 128/99  Pulse:  99  Resp: 19 14  Temp: 99 F (37.2 C) 99 F (37.2 C)  SpO2: 97% 98%    Tele box removed and returned.Skin clean, dry and intact without evidence of skin break down, no evidence of skin  tears noted. IV catheter discontinued intact. Site without signs and symptoms of complications. Dressing and pressure applied. Pt denies pain at this time. No complaints noted.  An After Visit Summary was printed and given to the patient. Patient escorted via [place], and D/C home via private auto. Father here to pick pt up.  Summer Buckley

## 2021-06-24 NOTE — Evaluation (Signed)
Occupational Therapy Evaluation Patient Details Name: Summer Buckley MRN: [id number] DOB: 10-01-1985 Today's Date: 06/24/2021   History of Present Illness 36 year old F with PMH of ADHD, chronic pain on opiate, postconcussive syndrome followed by neurology outpatient, chronic rotator cuff injury of right shoulder, anxiety, depression and "paroxysmal vocal cord closure in 2017" brought to ED with altered mental status, fall in bathroom and delusion.  Per patient's father, patient has been refusing to eat and drink over the last 3 to 4 days.  Eventually she became weak and fell in the bathroom.  He does not think she hit her head.  Denies loss of consciousness.   Clinical Impression   Pt seen for OT evaluation this date. Pt was independent in ADLs (although endorses requiring increased time/effort for tub/shower transfers) and functional mobility, living in a 1-story home with father and children. Pt currently presents with R shoulder pain, decreased strength, decreased balance, and decreased activity tolerance. Due to these functional impairments, pt requires MIN A for functional mobility of short household distances without AD, MIN GUARD for toilet transfers, and SUPERVISION for standing hand hygiene. Pt educated on on adaptive strategies for UB dressing and bathing in setting of R shoulder pain; pt verbalized understanding. Pt and family also educated of use of tub bench for increased safety during tub/shower transfers; pt and family verbalized understanding. Pt would benefit from additional skilled OT services to maximize return to PLOF and minimize risk of future falls, injury, caregiver burden, and readmission. Upon discharge, recommend [place] services.         Recommendations for follow up therapy are one component of a multi-disciplinary discharge planning process, led by the attending physician.  Recommendations may be updated based on patient status, additional functional criteria and insurance  authorization.   Follow Up Recommendations  Home health OT    Assistance Recommended at Discharge Intermittent Supervision/Assistance  Patient can return home with the following A little help with walking and/or transfers;A little help with bathing/dressing/bathroom    Functional Status Assessment  Patient has had a recent decline in their functional status and demonstrates the ability to make significant improvements in function in a reasonable and predictable amount of time.  Equipment Recommendations  Tub/shower bench    Recommendations for Other Services       Precautions / Restrictions Precautions Precautions: Fall Restrictions Weight Bearing Restrictions: No      Mobility Bed Mobility Overal bed mobility: Modified Independent Bed Mobility: Supine to Sit, Sit to Supine     Supine to sit: HOB elevated, Modified independent (Device/Increase time) Sit to supine: Modified independent (Device/Increase time), HOB elevated        Transfers Overall transfer level: Needs assistance Equipment used: 1 person hand held assist Transfers: Sit to/from Stand Sit to Stand: Min assist                  Balance Overall balance assessment: History of Falls, Needs assistance Sitting-balance support: No upper extremity supported, Feet supported Sitting balance-Leahy Scale: Good Sitting balance - Comments: good sitting balance reaching within BOS   Standing balance support: No upper extremity supported, During functional activity Standing balance-Leahy Scale: Fair Standing balance comment: requires only supervision for standing hand hygiene                           ADL either performed or assessed with clinical judgement   ADL Overall ADL's : Needs assistance/impaired     Grooming: Wash/dry  hands;Supervision/safety;Standing                   Toilet Transfer: Min guard;Ambulation;Regular Toilet           Functional mobility during ADLs: Minimal  assistance (MIN A via 1-person HHA)       Vision Patient Visual Report: No change from baseline              Pertinent Vitals/Pain Pain Assessment Pain Assessment: 0-10 Pain Score: 8  Pain Location: R shoulder Pain Descriptors / Indicators: Shooting, Sore Pain Intervention(s): Limited activity within patient's tolerance, Monitored during session     Hand Dominance Right   Extremity/Trunk Assessment Upper Extremity Assessment Upper Extremity Assessment: RUE deficits/detail RUE: Shoulder pain with ROM   Lower Extremity Assessment Lower Extremity Assessment: Generalized weakness   Cervical / Trunk Assessment Cervical / Trunk Assessment: Normal   Communication Communication Communication: No difficulties   Cognition Arousal/Alertness: Awake/alert Behavior During Therapy: Flat affect Overall Cognitive Status: Within Functional Limits for tasks assessed                                          Exercises Other Exercises Other Exercises: Education on adaptive strategies for UB dressing and bathing in setting of R shoulder pain, pt verbalized understanding Other Exercises: Educated pt and family of use of tub bench for increased safety during tub/shower transfers. Pt and family verbalized understanding        Home Living Family/patient expects to be discharged to:: Private residence Living Arrangements: Parent;Children Available Help at Discharge: Family;Available PRN/intermittently Type of Home: House Home Access: Stairs to enter [employer] of Steps: 7 Entrance Stairs-Rails: Can reach both Home Layout: One level     Bathroom Shower/Tub: Teacher, early years/pre: Standard Bathroom Accessibility: No   Home Equipment: Conservation officer, nature (2 wheels)          Prior Functioning/Environment Prior Level of Function : Independent/Modified Independent;History of Falls (last six months)             Mobility Comments: Pt reports  some difficulty with navigating steps and navigating her tub shower. ADLs Comments: Pt reports independence with ADLs at baseline, although endorses requiiring increased time/effort for tub transfers        OT Problem List: Decreased range of motion;Decreased activity tolerance;Impaired balance (sitting and/or standing);Impaired UE functional use;Pain      OT Treatment/Interventions: Self-care/ADL training;Therapeutic exercise;DME and/or AE instruction;Therapeutic activities;Patient/family education;Balance training    OT Goals(Current goals can be found in the care plan section) Acute Rehab OT Goals Patient Stated Goal: to have less pain OT Goal Formulation: With patient Time For Goal Achievement: 07/08/21 Potential to Achieve Goals: Good ADL Goals Pt Will Perform Upper Body Dressing: with set-up;sitting Pt Will Perform Lower Body Dressing: with supervision;sit to/from stand Pt Will Transfer to Toilet: with modified independence;ambulating;regular height toilet  OT Frequency: Min 2X/week       AM-PAC OT "6 Clicks" Daily Activity     Outcome Measure Help from another person eating meals?: None Help from another person taking care of personal grooming?: A Little Help from another person toileting, which includes using toliet, bedpan, or urinal?: A Little Help from another person bathing (including washing, rinsing, drying)?: A Little Help from another person to put on and taking off regular upper body clothing?: A Little Help from another person to put on and  taking off regular lower body clothing?: A Little 6 Click Score: 19   End of Session Nurse Communication: Mobility status  Activity Tolerance: Patient tolerated treatment well Patient left: in bed;with call bell/phone within reach;with bed alarm set;with family/visitor present  OT Visit Diagnosis: History of falling (Z91.81);Unsteadiness on feet (R26.81);Pain Pain - Right/Left: Right Pain - part of body: Shoulder                 Time: 8721-5872 OT Time Calculation (min): 18 min Charges:  OT General Charges $OT Visit: 1 Visit OT Evaluation $OT Eval Moderate Complexity: 1 Mod OT Treatments $Self Care/Home Management : 8-22 mins  Fredirick Maudlin, OTR/L [place]

## 2021-06-24 NOTE — Discharge Summary (Signed)
Physician Discharge Summary  Summer Buckley CHY:[id number] DOB: March 02, 1986 DOA: 06/21/2021  PCP: [facility]  Admit date: 06/21/2021 Discharge date: 06/24/2021 Admitted From: Home Disposition: Home Recommendations for Outpatient Follow-up:  Follow ups as below. Please obtain CBC/BMP/Mag at follow up Please follow up on the following pending results: None Home Health: PT/OT Equipment/Devices: Cane Discharge Condition: Stable CODE STATUS: Full code  Follow-up Information     [place], [facility]. Schedule an appointment as soon as possible for a visit in 1 week(s).   Contact information: [place] [address] [phone]                Hospital Course: 36 year old F with PMH of ADHD, chronic pain on opiate, postconcussive syndrome followed by neurology outpatient, chronic rotator cuff injury of right shoulder, anxiety, depression and "paroxysmal vocal cord closure in 2017" brought to ED with altered mental status, fall in bathroom and delusion.  Per patient's father, patient has been refusing to eat and drink over the last 3 to 4 days.  Eventually she became weak and fell in the bathroom.  He does not think she hit her head.  Denies loss of consciousness.     Patient was admitted for acute metabolic encephalopathy/delusion, elevated blood pressure, SIRS and possible polycythemia with thrombocytosis.  CT head, CTA chest and CXR without acute finding.  Patient was started on IV fluid.  Psychiatry consulted.  Patient's symptoms, encephalopathy, SIRS, polycythemia and electrolyte derangement resolved with IV hydration and electrolyte replenishment.  She was evaluated by psychiatry who recommended weaning of benzodiazepine, and cleared patient for outpatient follow-up.  Patient has chronic right shoulder/rotator cuff syndrome.  Right shoulder x-ray did not reveal osseous abnormalities.  MRI with muscle strains or tears involving the supraspinatus  and infraspinous muscles with mild infraspinatus tendinopathy, prior bicep tendinosis and type III anterior capsular attachment which can predispose to glenohumeral instability, and moderate glenohumeral joint effusion.  This was discussed with on-call orthopedic surgery, Dr. Posey Pronto, who recommended NSAIDs as needed and outpatient follow-up with her orthopedic surgeon at [facility].  See individual problem list below for more on hospital course.  Discharge Diagnoses:  Acute metabolic encephalopathy/delusions-patient with ADHD, anxiety, depression, PTSD and postconcussion syndrome.  Likely from dehydration.  Work-up including CT head, TSH, EtOH and salicylate level are unrevealing.  Patient is on significant dose of Xanax and Adderall at home although her UDS is negative here.  Encephalopathy resolved with IV fluid hydration and decreasing Xanax. -Psychiatry recommended weaning of Xanax and outpatient follow-up. -Decreased on Xanax to 1 mg 3 times daily -Patient is encouraged to discuss further adjustment with his psychiatrist outpatient.   SIRS/lactic acidosis: Likely from dehydration.  Sepsis ruled out.   Polycythemia and thrombocytosis-likely from hemoconcentration in setting of dehydration.  Resolved.   Uncontrolled hypertension/hypertensive urgency:  Not on antihypertensive meds at home.  SBP within normal.  DBP slightly elevated.  Patient is on Adderall which could increase her BP   Mildly elevated troponin: Likely demand ischemia.  No chest pain. -No indication for further work-up.   Hypokalemia: Resolved  Right shoulder pain/history of rotator cuff injury-chronic issue.  X-ray and MRI as above.  On-call orthopedic surgery recommended NSAID as needed and outpatient follow-up with her orthopedic surgeon   Chronic back pain/vertebral DDD-patient is on Subutex at home. -Continue home medications.   History of admission for stridor secondary to paroxysmal vocal cord closure 2017 -No respiratory  issue.   Leukocytosis/bandemia: Likely demargination in  the setting of dehydration.  Resolved.  Physical deconditioning -HH PT/OT and cane ordered.  Body mass index is 22.78 kg/m.           Discharge Exam: Vitals:   06/23/21 2029 06/23/21 2340 06/24/21 0540 06/24/21 0737  BP: (!) 132/98 (!) 128/94 (!) 124/91 (!) 128/99  Pulse: 92 80  99  Temp: 98.7 F (37.1 C) 98.9 F (37.2 C) 99 F (37.2 C) 99 F (37.2 C)  Resp: 18 15 19 14   Height:      Weight:      SpO2: 99% 98% 97% 98%  TempSrc: Oral Oral Oral Oral  BMI (Calculated):         GENERAL: No apparent distress.  Nontoxic. HEENT: MMM.  Vision and hearing grossly intact.  NECK: Supple.  No apparent JVD.  RESP: 98% on RA.  No IWOB.  Fair aeration bilaterally. CVS:  RRR. Heart sounds normal.  ABD/GI/GU: Bowel sounds present. Soft. Non tender.  MSK/EXT:  Moves extremities.  RUE weakness and Limited ROM in right shoulder. SKIN: no apparent skin lesion or wound NEURO: Awake and alert.  Oriented x4.  No apparent focal neuro deficit. PSYCH: Calm. Normal affect.   Discharge Instructions  Discharge Instructions     Call MD for:  difficulty breathing, headache or visual disturbances   Complete by: As directed    Call MD for:  extreme fatigue   Complete by: As directed    Call MD for:  persistant dizziness or light-headedness   Complete by: As directed    Call MD for:  persistant nausea and vomiting   Complete by: As directed    Call MD for:  temperature >100.4   Complete by: As directed    Diet general   Complete by: As directed    Discharge instructions   Complete by: As directed    It has been a pleasure taking care of you!  You were hospitalized due to altered mental status and severe dehydration.  We are concerned that some of your medications such as Xanax could be contributing to your altered mental status.  We recommend cutting down on the dose.  We also recommend you discuss about this medication with your  prescriber/doctor.  Follow-up with your primary care doctor and psychiatrist in 37 weeks or sooner if needed.    Take care,   Increase activity slowly   Complete by: As directed       Allergies as of 06/24/2021       Reactions   Tape Itching, Rash   TEGADERM        Medication List     TAKE these medications    alprazolam 2 MG tablet Commonly known as: XANAX Take 0.5 tablets (1 mg total) by mouth 3 (three) times daily. What changed: how much to take   amphetamine-dextroamphetamine 15 MG 24 hr capsule Commonly known as: ADDERALL XR Take 15 mg by mouth. Take one capsule by mouth once daily in the afternoon.   amphetamine-dextroamphetamine 30 MG 24 hr capsule Commonly known as: ADDERALL XR Take 30 mg by mouth. Take one capsule by mouth once daily in the morning.   amphetamine-dextroamphetamine 30 MG tablet Commonly known as: ADDERALL Take 30 mg by mouth 2 (two) times daily. As directed   buprenorphine 8 MG Subl SL tablet Commonly known as: SUBUTEX Place 8 mg under the tongue 3 (three) times daily.   buPROPion 200 MG 12 hr tablet Commonly known as: WELLBUTRIN SR Take 200  mg by mouth every morning.   EPINEPHrine 0.3 mg/0.3 mL Soaj injection Commonly known as: EPI-PEN Inject 0.3 mg into the muscle daily as needed for anaphylaxis.   FLUoxetine 20 MG capsule Commonly known as: PROZAC Take 20 mg by mouth daily.   multivitamins with iron Tabs tablet Take 1 tablet by mouth daily.               Durable Medical Equipment  (From admission, onward)           Start     Ordered   06/24/21 0743  For home use only DME Cane  Once        06/24/21 6269            Consultations: Psychiatry Orthopedic surgery over the phone  Procedures/Studies:   DG Shoulder Right  Result Date: 06/21/2021 CLINICAL DATA:  Fall, right shoulder pain EXAM: RIGHT SHOULDER - 2+ VIEW COMPARISON:  09/18/2015 FINDINGS: There is no evidence of fracture or dislocation.  Focal lucency within the a proximal metaphyseal region of the right humerus is in keeping with prior biceps tendon repair. There is no evidence of arthropathy or other focal bone abnormality. Soft tissues are unremarkable. IMPRESSION: Negative. Electronically Signed   By: Fidela Salisbury M.D.   On: 06/21/2021 20:19   CT Head Wo Contrast  Result Date: 06/21/2021 CLINICAL DATA:  Altered mental status EXAM: CT HEAD WITHOUT CONTRAST TECHNIQUE: Contiguous axial images were obtained from the base of the skull through the vertex without intravenous contrast. RADIATION DOSE REDUCTION: This exam was performed according to the departmental dose-optimization program which includes automated exposure control, adjustment of the mA and/or kV according to patient size and/or use of iterative reconstruction technique. COMPARISON:  08/30/2020 FINDINGS: Brain: Normal anatomic configuration. No abnormal intra or extra-axial mass lesion or fluid collection. No abnormal mass effect or midline shift. No evidence of acute intracranial hemorrhage or infarct. Ventricular size is normal. Cerebellum unremarkable. Vascular: Unremarkable Skull: Intact Sinuses/Orbits: Paranasal sinuses are clear. Orbits are unremarkable. Other: Mastoid air cells and middle ear cavities are clear. IMPRESSION: No acute intracranial abnormality.  Normal examination. Electronically Signed   By: Fidela Salisbury M.D.   On: 06/21/2021 20:38   CT Angio Chest Pulmonary Embolism (PE) W or WO Contrast  Result Date: 06/21/2021 CLINICAL DATA:  Pulmonary embolism (PE) suspected, unknown D-dimer EXAM: CT ANGIOGRAPHY CHEST WITH CONTRAST TECHNIQUE: Multidetector CT imaging of the chest was performed using the standard protocol during bolus administration of intravenous contrast. Multiplanar CT image reconstructions and MIPs were obtained to evaluate the vascular anatomy. RADIATION DOSE REDUCTION: This exam was performed according to the departmental dose-optimization  program which includes automated exposure control, adjustment of the mA and/or kV according to patient size and/or use of iterative reconstruction technique. CONTRAST:  77mL OMNIPAQUE IOHEXOL 350 MG/ML SOLN COMPARISON:  None. FINDINGS: Cardiovascular: Satisfactory opacification of the pulmonary arteries to the segmental level. No evidence of pulmonary embolism. Normal heart size. No pericardial effusion. Mediastinum/Nodes: No enlarged mediastinal, hilar, or axillary lymph nodes. Thyroid gland, trachea, and esophagus demonstrate no significant findings. Lungs/Pleura: Lungs are clear. No pleural effusion or pneumothorax. Upper Abdomen: No acute abnormality. Musculoskeletal: No chest wall abnormality. No acute or significant osseous findings. Review of the MIP images confirms the above findings. IMPRESSION: No pulmonary embolism.  No acute intrathoracic pathology identified. Electronically Signed   By: Fidela Salisbury M.D.   On: 06/21/2021 23:42   MR SHOULDER RIGHT W WO CONTRAST  Result Date: 06/23/2021 CLINICAL DATA:  Unwitnessed fall, right shoulder pain EXAM: MRI OF THE RIGHT SHOULDER WITHOUT AND WITH CONTRAST TECHNIQUE: Multiplanar, multisequence MR imaging of the right shoulder was performed before and after the administration of intravenous contrast. CONTRAST:  19mL GADAVIST GADOBUTROL 1 MMOL/ML IV SOLN COMPARISON:  Radiographs 06/21/2021 and MRI from 04/08/2016 FINDINGS: Rotator cuff:  Mild infraspinatus tendinopathy. Muscles: Abnormal edema primarily in the deep portion of the infraspinatus muscle near the scapula, but also to a lesser extent in the posterior portion of the supraspinatus muscle tracking along adjacent fascia planes, compatible with muscle strains or tears. Minimal associated accentuated enhancement noted. Biceps long head: The intra-articular segment of the long head of the biceps is not identified, the patient's operative note from 05/29/2016 indicates prior biceps tenodesis. The long head  of the biceps below the presumed tenodesis site is indistinct. Acromioclavicular Joint: No significant arthropathy. Type II acromion. No significant bursitis. Glenohumeral Joint: Moderate glenohumeral joint effusion. Somewhat thickened cord-like MCL likely part of a Buford complex. No labral tear identified. Type 3 anterior capsular attachment with the anterior capsular attachment about 1.2 cm from the labrum at the mid equatorial plane. Labrum:  No well-defined labral tear. Bones: Although edema in the supraspinatus and infraspinatus does track along the margin of the scapula, I have reviewed the patient's CTA chest from 06/21/2021 and I do not see a discrete right scapular fracture on that exam. No acute bony findings. Other: No supplemental non-categorized findings. IMPRESSION: 1. Muscle strains or tears involving the supraspinatus and infraspinatus muscles. 2. Mild infraspinatus tendinopathy.  No rotator cuff tear. 3. Prior biceps tenodesis, with indistinct long head of the biceps distally. 4. Type 3 anterior capsular attachment which can predispose to glenohumeral instability. 5. Moderate glenohumeral joint effusion. Electronically Signed   By: Van Clines M.D.   On: 06/23/2021 13:25   DG Chest Portable 1 View  Result Date: 06/21/2021 CLINICAL DATA:  Fall, chest pain EXAM: PORTABLE CHEST 1 VIEW COMPARISON:  08/30/2020 FINDINGS: The heart size and mediastinal contours are within normal limits. Both lungs are clear. The visualized skeletal structures are unremarkable. IMPRESSION: No active disease. Electronically Signed   By: Fidela Salisbury M.D.   On: 06/21/2021 20:18       The results of significant diagnostics from this hospitalization (including imaging, microbiology, ancillary and laboratory) are listed below for reference.     Microbiology: Recent Results (from the past 240 hour(s))  Resp Panel by RT-PCR (Flu A&B, Covid) Nasopharyngeal Swab     Status: None   Collection Time: 06/21/21   7:30 PM   Specimen: Nasopharyngeal Swab; Nasopharyngeal(NP) swabs in vial transport medium  Result Value Ref Range Status   SARS Coronavirus 2 by RT PCR NEGATIVE NEGATIVE Final    Comment: (NOTE) SARS-CoV-2 target nucleic acids are NOT DETECTED.  The SARS-CoV-2 RNA is generally detectable in upper respiratory specimens during the acute phase of infection. The lowest concentration of SARS-CoV-2 viral copies this assay can detect is 138 copies/mL. A negative result does not preclude SARS-Cov-2 infection and should not be used as the sole basis for treatment or other patient management decisions. A negative result may occur with  improper specimen collection/handling, submission of specimen other than nasopharyngeal swab, presence of viral mutation(s) within the areas targeted by this assay, and inadequate number of viral copies(<138 copies/mL). A negative result must be combined with clinical observations, patient history, and epidemiological information. The expected result is Negative.  Fact Sheet for Patients:  EntrepreneurPulse.com.au  Fact Sheet for Healthcare Providers:  IncredibleEmployment.be  This test is no t yet approved or cleared by the [place] and  has been authorized for detection and/or diagnosis of SARS-CoV-2 by FDA under an Emergency Use Authorization (EUA). This EUA will remain  in effect (meaning this test can be used) for the duration of the COVID-19 declaration under Section 564(b)(1) of the Act, 21 U.S.C.section 360bbb-3(b)(1), unless the authorization is terminated  or revoked sooner.       Influenza A by PCR NEGATIVE NEGATIVE Final   Influenza B by PCR NEGATIVE NEGATIVE Final    Comment: (NOTE) The Xpert Xpress SARS-CoV-2/FLU/RSV plus assay is intended as an aid in the diagnosis of influenza from Nasopharyngeal swab specimens and should not be used as a sole basis for treatment. Nasal washings and aspirates  are unacceptable for Xpert Xpress SARS-CoV-2/FLU/RSV testing.  Fact Sheet for Patients: EntrepreneurPulse.com.au  Fact Sheet for Healthcare Providers: IncredibleEmployment.be  This test is not yet approved or cleared by the [place] FDA and has been authorized for detection and/or diagnosis of SARS-CoV-2 by FDA under an Emergency Use Authorization (EUA). This EUA will remain in effect (meaning this test can be used) for the duration of the COVID-19 declaration under Section 564(b)(1) of the Act, 21 U.S.C. section 360bbb-3(b)(1), unless the authorization is terminated or revoked.  Performed at [facility], [address] [place],  [address]      Labs:  CBC: Recent Labs  Lab 06/21/21 1930 06/22/21 0539 06/23/21 0647  WBC 14.6* 11.2* 6.5  NEUTROABS 11.5*  --   --   HGB 17.7* 13.8 12.5  HCT 50.3* 41.2 36.9  MCV 84.0 87.1 85.2  PLT 439* 289 241   BMP &GFR Recent Labs  Lab 06/21/21 1930 06/22/21 0539 06/23/21 0647 06/24/21 0530  NA 141 146* 137 137  K 3.0* 3.4* 3.4* 3.7  CL 104 109 101 102  CO2 23 27 26 27   GLUCOSE 167* 101* 90 97  BUN 45* 25* 9 8  CREATININE 0.83 0.63 0.38* 0.50  CALCIUM 9.5 8.8* 8.4* 8.9  MG  --  2.4 1.9 2.2  PHOS  --   --  2.7 3.8   Estimated Creatinine Clearance: 87.5 mL/min (by C-G formula based on SCr of 0.5 mg/dL). Liver & Pancreas: Recent Labs  Lab 06/21/21 1930 06/22/21 0539 06/23/21 0647 06/24/21 0530  AST 23 19  --   --   ALT 12 9  --   --   ALKPHOS 66 57  --   --   BILITOT 1.1 1.1  --   --   PROT 8.7* 6.7  --   --   ALBUMIN 4.5 3.5 3.1* 3.5   No results for input(s): LIPASE, AMYLASE in the last 168 hours. No results for input(s): AMMONIA in the last 168 hours. Diabetic: No results for input(s): HGBA1C in the last 72 hours. No results for input(s): GLUCAP in the last 168 hours. Cardiac Enzymes: Recent Labs  Lab 06/23/21 0647  CKTOTAL 174   No results for  input(s): PROBNP in the last 8760 hours. Coagulation Profile: No results for input(s): INR, PROTIME in the last 168 hours. Thyroid Function Tests: Recent Labs    06/21/21 1930  TSH 1.002   Lipid Profile: No results for input(s): CHOL, HDL, LDLCALC, TRIG, CHOLHDL, LDLDIRECT in the last 72 hours. Anemia Panel: Recent Labs    06/23/21 0647  VITAMINB12 201   Urine analysis:    Component Value Date/Time   COLORURINE YELLOW (A) 06/21/2021 2145  APPEARANCEUR HAZY (A) 06/21/2021 2145   APPEARANCEUR Clear 02/20/2012 0840   LABSPEC 1.024 06/21/2021 2145   LABSPEC 1.011 02/20/2012 0840   PHURINE 5.0 06/21/2021 2145   GLUCOSEU 50 (A) 06/21/2021 2145   GLUCOSEU Negative 02/20/2012 0840   HGBUR SMALL (A) 06/21/2021 2145   BILIRUBINUR NEGATIVE 06/21/2021 2145   BILIRUBINUR Negative 02/20/2012 0840   KETONESUR 20 (A) 06/21/2021 2145   PROTEINUR >=300 (A) 06/21/2021 2145   NITRITE NEGATIVE 06/21/2021 2145   LEUKOCYTESUR NEGATIVE 06/21/2021 2145   LEUKOCYTESUR Negative 02/20/2012 0840   Sepsis Labs: Invalid input(s): PROCALCITONIN, LACTICIDVEN   Time coordinating discharge: 45 minutes  SIGNED:  Mercy Riding, MD  [facility] 06/24/2021, 1:50 PM

## 2021-06-24 NOTE — TOC Transition Note (Signed)
Transition of Care [facility]) - CM/SW Discharge Note   Patient Details  Name: Summer Buckley MRN: [id number] Date of Birth: 1986/01/30  [facility] CM/SW Contact:  Alberteen Sam, LCSW Phone Number: 06/24/2021, 9:56 AM   Clinical Narrative:     Patient to [place] home today, CSW notes PT/OT recs for [facility] due to patient shoulder pain, however CSW spoke with patient regarding barriers with her medicaid insurance for home health.   Patient identifies extensive home support with father picking her up today and sister at bedside.   She is agreeable to do outpatient PT and OT at [facility] main campus. CSW has faxed referral to them at [phone].   Patient reports her and her father will most likely get a tub bench after she discharges but will let CSW know if anything changes.   [place], [place]   Final next level of care: Home/Self Care Barriers to Discharge: No Barriers Identified   Patient Goals and CMS Choice Patient states their goals for this hospitalization and ongoing recovery are:: to go home CMS Medicare.gov Compare Post Acute Care list provided to:: Patient Choice offered to / list presented to : Patient  Discharge Placement                    Patient and family notified of of transfer: 06/24/21  Discharge Plan and Services                                     Social Determinants of Health (SDOH) Interventions     Readmission Risk Interventions No flowsheet data found.

## 2021-06-24 NOTE — Progress Notes (Signed)
Occupational Therapy * Physical Therapy * Speech Therapy  DATE 06/24/21  PATIENT NAME: Summer Buckley PATIENT MRN: [id number]  DIAGNOSIS/DIAGNOSIS CODE : G93.41 DATE OF DISCHARGE: 06/24/21  PRIMARY CARE PHYSICIAN:[facility] PCP PHONE: [phone] Dear Provider (Name: [facility], Fax: [phone]:  I certify that I have examined this patient and that occupational/physical/speech therapy is necessary on an outpatient basis.  The patient has expressed interest in completing their recommended course of therapy at your  location. Once a formal order from the patients primary care physician has been obtained, please contact him/her to schedule an appointment for evaluation at your earliest convenience.  [ ]  Physical Therapy Evaluate and Treat  [ X] Occupational Therapy Evaluate and Treat  [ X] Speech Therapy Evaluate and Treat  The patients primary care physician (listed above) must furnish and be responsible for a formal order such that the recommended services may be furnished while under the primary physicians care, and that the plan of care will be established and reviewed every 30 days (or more often if condition necessitates).    Hospitalist/Attending Physician Signature NOTE: Diagnosis and signature are required to validate order Date  Dr. Simona Huh, [place] [phone]   ____________________________________________________________________

## 2021-09-20 ENCOUNTER — Emergency Department
Admission: EM | Admit: 2021-09-20 | Discharge: 2021-09-21 | Disposition: A | Discharge status: Home / Self Care | Payer: Medicaid Other | Attending: Emergency Medicine | Admitting: Emergency Medicine

## 2021-09-20 ENCOUNTER — Emergency Department: Payer: Medicaid Other

## 2021-09-20 DIAGNOSIS — R569 Unspecified convulsions: Secondary | ICD-10-CM

## 2021-09-20 DIAGNOSIS — F1510 Other stimulant abuse, uncomplicated: Secondary | ICD-10-CM | POA: Diagnosis not present

## 2021-09-20 DIAGNOSIS — E876 Hypokalemia: Secondary | ICD-10-CM

## 2021-09-20 DIAGNOSIS — R000 Tachycardia, unspecified: Secondary | ICD-10-CM | POA: Diagnosis not present

## 2021-09-20 DIAGNOSIS — G40909 Epilepsy, unspecified, not intractable, without status epilepticus: Secondary | ICD-10-CM | POA: Diagnosis not present

## 2021-09-20 DIAGNOSIS — E860 Dehydration: Secondary | ICD-10-CM | POA: Insufficient documentation

## 2021-09-20 DIAGNOSIS — N390 Urinary tract infection, site not specified: Secondary | ICD-10-CM | POA: Diagnosis not present

## 2021-09-20 DIAGNOSIS — D72829 Elevated white blood cell count, unspecified: Secondary | ICD-10-CM | POA: Diagnosis not present

## 2021-09-20 MED ORDER — SODIUM CHLORIDE 0.9 % IV BOLUS
1000.0000 mL | Freq: Once | INTRAVENOUS | Status: AC
  Administered 2021-09-20: 1000 mL via INTRAVENOUS

## 2021-09-20 MED ORDER — LORAZEPAM 2 MG/ML IJ SOLN
0.5000 mg | Freq: Once | INTRAMUSCULAR | Status: AC
  Administered 2021-09-20: 0.5 mg via INTRAVENOUS
  Filled 2021-09-20: qty 1
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
## 2021-09-20 NOTE — ED Triage Notes (Signed)
36 y/o female arrived to the [facility] via EMS coming from home with a CC of of seizures. Pt notes she had seizures yesterday and today. Pt has a history of seizures, schizophrenia  Pt notes she takes adderall and suboxone. Pt denies taking meds for seizure. EMS BGL was 91. Pt arrived alert and oriented x4 upon arrival to [facility] room 6    ?

## 2021-09-21 LAB — URINALYSIS, ROUTINE W REFLEX MICROSCOPIC
Bacteria, UA: NONE SEEN
Bilirubin Urine: NEGATIVE
Glucose, UA: NEGATIVE mg/dL
Hgb urine dipstick: NEGATIVE
Ketones, ur: 80 mg/dL — AB
Nitrite: NEGATIVE
Protein, ur: 100 mg/dL — AB
Specific Gravity, Urine: 1.029 (ref 1.005–1.030)
pH: 5 (ref 5.0–8.0)

## 2021-09-21 LAB — URINE DRUG SCREEN, QUALITATIVE (ARMC ONLY)
Amphetamines, Ur Screen: POSITIVE — AB
Barbiturates, Ur Screen: NOT DETECTED
Benzodiazepine, Ur Scrn: POSITIVE — AB
Cannabinoid 50 Ng, Ur ~~LOC~~: NOT DETECTED
Cocaine Metabolite,Ur ~~LOC~~: NOT DETECTED
MDMA (Ecstasy)Ur Screen: NOT DETECTED
Methadone Scn, Ur: NOT DETECTED
Opiate, Ur Screen: POSITIVE — AB
Phencyclidine (PCP) Ur S: NOT DETECTED
Tricyclic, Ur Screen: NOT DETECTED

## 2021-09-21 LAB — CBC WITH DIFFERENTIAL/PLATELET
Abs Immature Granulocytes: 0.04 10*3/uL (ref 0.00–0.07)
Basophils Absolute: 0.1 10*3/uL (ref 0.0–0.1)
Basophils Relative: 1 %
Eosinophils Absolute: 0 10*3/uL (ref 0.0–0.5)
Eosinophils Relative: 0 %
HCT: 47.7 % — ABNORMAL HIGH (ref 36.0–46.0)
Hemoglobin: 15.9 g/dL — ABNORMAL HIGH (ref 12.0–15.0)
Immature Granulocytes: 0 %
Lymphocytes Relative: 29 %
Lymphs Abs: 3.3 10*3/uL (ref 0.7–4.0)
MCH: 28.2 pg (ref 26.0–34.0)
MCHC: 33.3 g/dL (ref 30.0–36.0)
MCV: 84.7 fL (ref 80.0–100.0)
Monocytes Absolute: 0.8 10*3/uL (ref 0.1–1.0)
Monocytes Relative: 7 %
Neutro Abs: 7.3 10*3/uL (ref 1.7–7.7)
Neutrophils Relative %: 63 %
Platelets: 369 10*3/uL (ref 150–400)
RBC: 5.63 MIL/uL — ABNORMAL HIGH (ref 3.87–5.11)
RDW: 12.5 % (ref 11.5–15.5)
WBC: 11.5 10*3/uL — ABNORMAL HIGH (ref 4.0–10.5)
nRBC: 0 % (ref 0.0–0.2)

## 2021-09-21 LAB — COMPREHENSIVE METABOLIC PANEL
ALT: 12 U/L (ref 0–44)
AST: 18 U/L (ref 15–41)
Albumin: 5.3 g/dL — ABNORMAL HIGH (ref 3.5–5.0)
Alkaline Phosphatase: 66 U/L (ref 38–126)
Anion gap: 15 (ref 5–15)
BUN: 24 mg/dL — ABNORMAL HIGH (ref 6–20)
CO2: 22 mmol/L (ref 22–32)
Calcium: 10.2 mg/dL (ref 8.9–10.3)
Chloride: 104 mmol/L (ref 98–111)
Creatinine, Ser: 0.73 mg/dL (ref 0.44–1.00)
GFR, Estimated: >60 mL/min (ref >60)
Glucose, Bld: 115 mg/dL — ABNORMAL HIGH (ref 70–99)
Potassium: 3.3 mmol/L — ABNORMAL LOW (ref 3.5–5.1)
Sodium: 141 mmol/L (ref 135–145)
Total Bilirubin: 1.4 mg/dL — ABNORMAL HIGH (ref 0.3–1.2)
Total Protein: 9.4 g/dL — ABNORMAL HIGH (ref 6.5–8.1)

## 2021-09-21 LAB — SALICYLATE LEVEL: Salicylate Lvl: <7 mg/dL — ABNORMAL LOW (ref 7.0–30.0)

## 2021-09-21 LAB — ACETAMINOPHEN LEVEL: Acetaminophen (Tylenol), Serum: <10 ug/mL — ABNORMAL LOW (ref 10–30)

## 2021-09-21 LAB — TROPONIN I (HIGH SENSITIVITY): Troponin I (High Sensitivity): 9 ng/L (ref <18)

## 2021-09-21 LAB — ETHANOL: Alcohol, Ethyl (B): <10 mg/dL (ref <10)

## 2021-09-21 LAB — HCG, QUANTITATIVE, PREGNANCY: hCG, Beta Chain, Quant, S: 2 m[IU]/mL (ref <5)

## 2021-09-21 MED ORDER — FOSFOMYCIN TROMETHAMINE 3 G PO PACK
3.0000 g | PACK | Freq: Once | ORAL | Status: AC
  Administered 2021-09-21: 3 g via ORAL
  Filled 2021-09-21: qty 3

## 2021-09-21 MED ORDER — SODIUM CHLORIDE 0.9 % IV BOLUS
1000.0000 mL | Freq: Once | INTRAVENOUS | Status: AC
  Administered 2021-09-21: 1000 mL via INTRAVENOUS

## 2021-09-21 NOTE — Discharge Instructions (Signed)
You had seizure-like activity tonight.  Do not drive or operate machinery until cleared by your doctor.  Drink plenty of fluids daily.  You were given potassium supplement and antibiotic for UTI while in the emergency department.  Return to the ER for recurrent or worsening symptoms, persistent vomiting, difficulty breathing or other concerns. ?

## 2021-10-16 ENCOUNTER — Ambulatory Visit
Admission: EM | Admit: 2021-10-16 | Discharge: 2021-10-16 | Disposition: A | Discharge status: Home / Self Care | Payer: Medicaid Other

## 2021-10-16 ENCOUNTER — Ambulatory Visit (INDEPENDENT_AMBULATORY_CARE_PROVIDER_SITE_OTHER): Payer: Medicaid Other

## 2021-10-16 DIAGNOSIS — M25562 Pain in left knee: Secondary | ICD-10-CM

## 2021-10-16 DIAGNOSIS — M25512 Pain in left shoulder: Secondary | ICD-10-CM

## 2021-10-16 DIAGNOSIS — M5127 Other intervertebral disc displacement, lumbosacral region: Secondary | ICD-10-CM | POA: Insufficient documentation

## 2021-10-16 DIAGNOSIS — N809 Endometriosis, unspecified: Secondary | ICD-10-CM | POA: Insufficient documentation

## 2021-10-16 DIAGNOSIS — W19XXXA Unspecified fall, initial encounter: Secondary | ICD-10-CM

## 2021-10-16 DIAGNOSIS — F419 Anxiety disorder, unspecified: Secondary | ICD-10-CM | POA: Insufficient documentation

## 2021-10-16 DIAGNOSIS — C539 Malignant neoplasm of cervix uteri, unspecified: Secondary | ICD-10-CM | POA: Insufficient documentation

## 2021-10-16 MED ORDER — MELOXICAM 15 MG PO TABS: 15.0000 mg | ORAL_TABLET | Freq: Every day | ORAL | 0 refills | Status: AC

## 2021-10-16 MED ORDER — KETOROLAC TROMETHAMINE 60 MG/2ML IM SOLN
30.0000 mg | Freq: Once | INTRAMUSCULAR | Status: AC
  Administered 2021-10-16: 30 mg via INTRAMUSCULAR

## 2021-10-16 MED ORDER — CYCLOBENZAPRINE HCL 10 MG PO TABS: 10.0000 mg | ORAL_TABLET | Freq: Every day | ORAL | 0 refills | Status: DC
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
## 2021-10-16 NOTE — ED Triage Notes (Signed)
Patient is here for "on Mother's day had a very bad fall, fell on some rocks, a lot of knee pain, the left has swelling with pain, left shoulder pain, unable to move arm due to this pain". Abrasions on knee's. Some left side head injury, No loc. No lacerations (abrasions only). Hard to straighten left leg.  ?

## 2021-10-16 NOTE — Discharge Instructions (Signed)
X-ray of the shoulder and knees are negative however if you continue to have limited movement to your left shoulder you will need higher level imaging such as an MRI to evaluate your rotator cuff ? ?Take meloxicam every morning for 7 days to help reduce inflammation which in turn will help with your pain, you may take 500 to 1000 mg of Tylenol every 6 hours in addition ? ?You may use muscle relaxer at bedtime for additional comfort ? ?May use sling as needed for support for the arm ? ?May apply heat over the affected area in 10 to 15-minute intervals ? ?May elevate your leg and your arm underneath pillows for support while sitting and lying ? ?If your symptoms do not improve with use of medicine or worsen please go to [facility] for further evaluation and management, information is listed below ?

## 2021-11-20 ENCOUNTER — Emergency Department: Payer: Medicaid Other

## 2021-11-20 ENCOUNTER — Emergency Department
Admission: EM | Admit: 2021-11-20 | Discharge: 2021-11-20 | Disposition: A | Discharge status: Home / Self Care | Payer: Medicaid Other | Attending: Emergency Medicine | Admitting: Emergency Medicine

## 2021-11-20 ENCOUNTER — Other Ambulatory Visit: Payer: Self-pay

## 2021-11-20 DIAGNOSIS — M79605 Pain in left leg: Secondary | ICD-10-CM | POA: Diagnosis not present

## 2021-11-20 DIAGNOSIS — W19XXXA Unspecified fall, initial encounter: Secondary | ICD-10-CM | POA: Diagnosis not present

## 2021-11-20 DIAGNOSIS — E876 Hypokalemia: Secondary | ICD-10-CM | POA: Diagnosis not present

## 2021-11-20 DIAGNOSIS — M545 Low back pain, unspecified: Secondary | ICD-10-CM | POA: Diagnosis not present

## 2021-11-20 DIAGNOSIS — W130XXA Fall from, out of or through balcony, initial encounter: Secondary | ICD-10-CM | POA: Insufficient documentation

## 2021-11-20 HISTORY — DX: Other stimulant abuse, uncomplicated: F15.10

## 2021-11-20 HISTORY — DX: Other psychoactive substance use, unspecified with psychoactive substance-induced psychotic disorder, unspecified: F19.959

## 2021-11-20 LAB — BASIC METABOLIC PANEL
Anion gap: 7 (ref 5–15)
BUN: 14 mg/dL (ref 6–20)
CO2: 29 mmol/L (ref 22–32)
Calcium: 9.5 mg/dL (ref 8.9–10.3)
Chloride: 105 mmol/L (ref 98–111)
Creatinine, Ser: 0.63 mg/dL (ref 0.44–1.00)
GFR, Estimated: >60 mL/min (ref >60)
Glucose, Bld: 106 mg/dL — ABNORMAL HIGH (ref 70–99)
Potassium: 3.2 mmol/L — ABNORMAL LOW (ref 3.5–5.1)
Sodium: 141 mmol/L (ref 135–145)

## 2021-11-20 LAB — CBC
HCT: 42.2 % (ref 36.0–46.0)
Hemoglobin: 13.7 g/dL (ref 12.0–15.0)
MCH: 28.4 pg (ref 26.0–34.0)
MCHC: 32.5 g/dL (ref 30.0–36.0)
MCV: 87.6 fL (ref 80.0–100.0)
Platelets: 324 10*3/uL (ref 150–400)
RBC: 4.82 MIL/uL (ref 3.87–5.11)
RDW: 12.9 % (ref 11.5–15.5)
WBC: 7.8 10*3/uL (ref 4.0–10.5)
nRBC: 0 % (ref 0.0–0.2)

## 2021-11-20 MED ORDER — KETOROLAC TROMETHAMINE 10 MG PO TABS
10.0000 mg | ORAL_TABLET | Freq: Four times a day (QID) | ORAL | 0 refills | Status: AC | PRN
Start: 2021-11-20 — End: 2021-11-25

## 2021-11-20 MED ORDER — KETOROLAC TROMETHAMINE 30 MG/ML IJ SOLN
30.0000 mg | Freq: Once | INTRAMUSCULAR | Status: AC
  Administered 2021-11-20: 30 mg via INTRAMUSCULAR
  Filled 2021-11-20: qty 1

## 2021-11-20 MED ORDER — POTASSIUM CHLORIDE CRYS ER 20 MEQ PO TBCR
40.0000 meq | EXTENDED_RELEASE_TABLET | Freq: Once | ORAL | Status: AC
  Administered 2021-11-20: 40 meq via ORAL
  Filled 2021-11-20: qty 2

## 2021-11-20 MED ORDER — METHOCARBAMOL 500 MG PO TABS
500.0000 mg | ORAL_TABLET | Freq: Once | ORAL | Status: AC
  Administered 2021-11-20: 500 mg via ORAL
  Filled 2021-11-20: qty 1

## 2021-11-20 NOTE — ED Notes (Addendum)
Patient c/o left side pain after falling through a porch. All radiology films were negative. Patient did not take any medication prior to coming to ED

## 2021-11-20 NOTE — ED Notes (Signed)
Patient c/o nausea and diarrhea and right lower quadrant abdominal pain that started yesterday. Went to urgent care and sent here to r/o appy.

## 2021-11-20 NOTE — ED Triage Notes (Signed)
Pt here with left leg pain. Pt states she fell through the porch. Pt c/o left leg, hip, and back pain. Pt has significant bruising to the left leg. PT not able to ambulate on affected leg.

## 2021-11-20 NOTE — Discharge Instructions (Signed)
You can take Toradol up to four times daily for the next five days.
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
## 2022-02-05 ENCOUNTER — Other Ambulatory Visit: Payer: Self-pay

## 2022-02-05 ENCOUNTER — Emergency Department: Payer: Medicaid Other

## 2022-02-05 ENCOUNTER — Emergency Department
Admission: EM | Admit: 2022-02-05 | Discharge: 2022-02-06 | Disposition: A | Discharge status: Home / Self Care | Payer: Medicaid Other | Attending: Emergency Medicine | Admitting: Emergency Medicine

## 2022-02-05 DIAGNOSIS — Z20822 Contact with and (suspected) exposure to covid-19: Secondary | ICD-10-CM | POA: Insufficient documentation

## 2022-02-05 DIAGNOSIS — R55 Syncope and collapse: Secondary | ICD-10-CM | POA: Diagnosis present

## 2022-02-05 DIAGNOSIS — Z79899 Other long term (current) drug therapy: Secondary | ICD-10-CM | POA: Diagnosis not present

## 2022-02-05 DIAGNOSIS — R4020 Unspecified coma: Secondary | ICD-10-CM

## 2022-02-05 DIAGNOSIS — E876 Hypokalemia: Secondary | ICD-10-CM | POA: Insufficient documentation

## 2022-02-05 DIAGNOSIS — Z96611 Presence of right artificial shoulder joint: Secondary | ICD-10-CM | POA: Insufficient documentation

## 2022-02-05 DIAGNOSIS — R079 Chest pain, unspecified: Secondary | ICD-10-CM | POA: Diagnosis not present

## 2022-02-05 LAB — CBC
HCT: 42.6 % (ref 36.0–46.0)
Hemoglobin: 14.3 g/dL (ref 12.0–15.0)
MCH: 29.1 pg (ref 26.0–34.0)
MCHC: 33.6 g/dL (ref 30.0–36.0)
MCV: 86.6 fL (ref 80.0–100.0)
Platelets: 283 10*3/uL (ref 150–400)
RBC: 4.92 MIL/uL (ref 3.87–5.11)
RDW: 12.5 % (ref 11.5–15.5)
WBC: 9 10*3/uL (ref 4.0–10.5)
nRBC: 0 % (ref 0.0–0.2)

## 2022-02-05 LAB — BASIC METABOLIC PANEL
Anion gap: 12 (ref 5–15)
BUN: 14 mg/dL (ref 6–20)
CO2: 23 mmol/L (ref 22–32)
Calcium: 9.3 mg/dL (ref 8.9–10.3)
Chloride: 102 mmol/L (ref 98–111)
Creatinine, Ser: 1.05 mg/dL — ABNORMAL HIGH (ref 0.44–1.00)
GFR, Estimated: >60 mL/min (ref >60)
Glucose, Bld: 156 mg/dL — ABNORMAL HIGH (ref 70–99)
Potassium: 3 mmol/L — ABNORMAL LOW (ref 3.5–5.1)
Sodium: 137 mmol/L (ref 135–145)

## 2022-02-05 LAB — TROPONIN I (HIGH SENSITIVITY): Troponin I (High Sensitivity): 7 ng/L (ref <18)

## 2022-02-05 MED ORDER — LORAZEPAM 2 MG/ML IJ SOLN
1.0000 mg | Freq: Once | INTRAMUSCULAR | Status: AC
  Administered 2022-02-05: 1 mg via INTRAVENOUS
  Filled 2022-02-05: qty 1

## 2022-02-05 MED ORDER — KETOROLAC TROMETHAMINE 30 MG/ML IJ SOLN
30.0000 mg | Freq: Once | INTRAMUSCULAR | Status: AC
  Administered 2022-02-05: 30 mg via INTRAVENOUS
  Filled 2022-02-05: qty 1

## 2022-02-05 MED ORDER — SODIUM CHLORIDE 0.9 % IV BOLUS (SEPSIS)
1000.0000 mL | Freq: Once | INTRAVENOUS | Status: AC
  Administered 2022-02-05: 1000 mL via INTRAVENOUS

## 2022-02-05 NOTE — ED Provider Notes (Signed)
[facility] Provider Note    Event Date/Time   First MD Initiated Contact with Patient 02/05/22 2259     (approximate)   History   Seizures and Tachycardia   HPI  Summer Buckley is a 36 y.o. female with history of substance abuse, chronic pain who presents to the emergency department with an episode of loss of consciousness.  Patient is not sure if she had a seizure.  States she is not on antiepileptics but states she has had seizures before when she has run out of her Xanax.  She states tonight that her boyfriend came home and told her to leave.  She states that he was with another woman.  She states she went to the car and had a panic attack and that is the last thing she remembers before waking up and having people surrounding her car.  She states she is having chest tightness, headache, sore throat.  No fevers, cough, vomiting or diarrhea.  No numbness, tingling or weakness.  No tongue biting, incontinence.  No known head injury.     History provided by patient.    Past Medical History:  Diagnosis Date   ADHD (attention deficit hyperactivity disorder)    Amphetamine abuse (HCC)    Anxiety    Arthritis    Bruises easily    Bulging lumbar disc    Chronic back pain    Depression    Endometriosis    Fibromyalgia    GERD (gastroesophageal reflux disease)    OCC-NO MEDS   Headache    History of methicillin resistant staphylococcus aureus (MRSA) 06/2015   + PCR AND MRSA ON NECK   PTSD (post-traumatic stress disorder)    Substance-induced psychotic disorder ([place])    Throat injury    PT STATES THAT SHE WAS STRANGLED YEARS AGO AND NOW HAS PROBLEMS WITH HER THROAT SWELLING-NORMALLY HAPPENS ONCE YEARLY -LAST HAPPENED IN JULY 2017    Past Surgical History:  Procedure Laterality Date   ABDOMINAL HYSTERECTOMY     APPENDECTOMY     BACK SURGERY     EXPLORATORY LAPAROTOMY     SHOULDER ARTHROSCOPY WITH BICEPS TENDON REPAIR Right 05/29/2016    Procedure: SHOULDER ARTHROSCOPY WITH MINI OPEN BICEPS TENDON REPAIR;  Surgeon: Corky Mull, MD;  Location: [facility] ORS;  Service: Orthopedics;  Laterality: Right;   SHOULDER ARTHROSCOPY WITH SUBACROMIAL DECOMPRESSION Right 05/29/2016   Procedure: SHOULDER ARTHROSCOPY WITH SUBACROMIAL DECOMPRESSION AND DEBRIDEMENT;  Surgeon: Corky Mull, MD;  Location: [facility] ORS;  Service: Orthopedics;  Laterality: Right;   TONSILLECTOMY      MEDICATIONS:  Prior to Admission medications   Medication Sig Start Date End Date Taking? Authorizing Provider  Acetaminophen-Codeine 300-30 MG tablet acetaminophen 300 mg-codeine 30 mg tablet  TAKE 1 TABLET EVERY 6-8 HOURS 08/02/20   [provider]  albuterol (VENTOLIN HFA) 108 (90 Base) MCG/ACT inhaler Inhale into the lungs. 02/08/16   [provider]  ALPRAZolam Duanne Moron) 1 MG tablet Take by mouth.    [provider]  alprazolam Duanne Moron) 2 MG tablet Take 0.5 tablets (1 mg total) by mouth 3 (three) times daily. 06/24/21   Mercy Riding, MD  amoxicillin (AMOXIL) 500 MG capsule amoxicillin 500 mg capsule  TAKE 1 CAPSULE BY MOUTH 3 TIMES A DAY 08/02/20   [provider]  amphetamine-dextroamphetamine (ADDERALL XR) 15 MG 24 hr capsule Take 15 mg by mouth. Take one capsule by mouth once daily in the afternoon.    [provider]  amphetamine-dextroamphetamine (ADDERALL XR) 30 MG 24 hr capsule Take 30 mg by mouth. Take one capsule by mouth once daily in the morning.    [provider]  amphetamine-dextroamphetamine (ADDERALL) 10 MG tablet Take by mouth.    [provider]  amphetamine-dextroamphetamine (ADDERALL) 20 MG tablet Take 30 mg by mouth 2 (two) times daily. 08/30/21   [provider]  buprenorphine (SUBUTEX) 8 MG SUBL SL tablet Place 8 mg under the tongue 3 (three) times daily. 08/31/20   [provider]  buPROPion (WELLBUTRIN SR) 200 MG 12 hr tablet Take by mouth.    [provider]   cephALEXin (KEFLEX) 500 MG capsule cephalexin 500 mg capsule  TAKE 1 CAPSULE BY MOUTH EVERY 6 HOURS FOR 7 DAYS    [provider]  cyclobenzaprine (FLEXERIL) 10 MG tablet Take 1 tablet (10 mg total) by mouth at bedtime. 10/16/21   White, Leitha Schuller, NP  diazepam (VALIUM) 10 MG tablet Valium 10 mg tablet    [provider]  diclofenac (FLECTOR) 1.3 % PTCH Flector 1.3 % transdermal 12 hour patch  Apply 1 patch twice a day by transdermal route.    [provider]  EPINEPHrine 0.3 mg/0.3 mL IJ SOAJ injection Inject 0.3 mg into the muscle daily as needed for anaphylaxis. 02/13/16   [provider]  estradiol (VIVELLE-DOT) 0.025 MG/24HR Place onto the skin.    [provider]  estrogens, conjugated, (PREMARIN) 0.625 MG tablet Take by mouth.    [provider]  estrogens, conjugated, (PREMARIN) 1.25 MG tablet Enjuvia 1.25 mg tablet    [provider]  fluocinonide cream (LIDEX) 0.05 % Apply topically. 07/25/15   [provider]  FLUoxetine (PROZAC) 20 MG capsule Take 20 mg by mouth daily.    [provider]  fluticasone (FLONASE) 50 MCG/ACT nasal spray Place into the nose. 08/18/17   [provider]  gabapentin (NEURONTIN) 100 MG capsule Take by mouth. 12/02/17   [provider]  HYDROcodone-acetaminophen (NORCO/VICODIN) 5-325 MG tablet every 6 (six) hours 04/28/18   [provider]  ibuprofen (ADVIL) 200 MG tablet Take by mouth.    [provider]  ibuprofen (ADVIL) 800 MG tablet Take by mouth. 05/14/16   [provider]  meloxicam (MOBIC) 15 MG tablet Take 1 tablet (15 mg total) by mouth daily. 10/16/21   Hans Eden, NP  Multiple Vitamins-Iron (MULTIVITAMINS WITH IRON) TABS tablet Take 1 tablet by mouth daily. 06/24/21   Mercy Riding, MD  naloxone [facility]) nasal spray 4 mg/0.1 mL naloxone 4 mg/actuation nasal spray  admin into THE nose AS DIRECTED by packaging 07/19/20    [provider]  nortriptyline (PAMELOR) 10 MG capsule nortriptyline 10 mg capsule  TAKE 1 CAP NIGHTLY FOR 1 WEEK, THEN INCREASE TO 2 CAPS NIGHTLY FOR POST CONCUSSION SYNDROME. 12/05/20   [provider]  oxyCODONE (OXY IR/ROXICODONE) 5 MG immediate release tablet Take by mouth. 07/09/16   [provider]  oxyCODONE-acetaminophen (PERCOCET/ROXICET) 5-325 MG tablet Take by mouth.    [provider]  prazosin (MINIPRESS) 2 MG capsule prazosin 2 mg capsule  TAKE ONE CAPSULE BY MOUTH EVERY NIGHT AT BEDTIME    [provider]  prazosin (MINIPRESS) 2 MG capsule Take 2 mg by mouth at bedtime. 07/09/21   [provider]  pregabalin (LYRICA) 75 MG capsule Lyrica 75 mg capsule  Take 1 capsule twice a day by oral route.    [provider]  QUEtiapine (SEROQUEL) 50 MG tablet Take by mouth.    [provider]  tizanidine (ZANAFLEX) 2 MG capsule Take by mouth. 12/25/20   [provider]  tiZANidine (ZANAFLEX) 4 MG tablet tizanidine 4 mg tablet  PLEASE SEE ATTACHED FOR DETAILED DIRECTIONS    [provider]  traMADol (ULTRAM) 50 MG tablet tramadol 50 mg tablet  Take 1 tablet(s) every 6-8 hours by oral route. 06/27/14   [provider]  dicyclomine (BENTYL) 10 MG capsule Take 1 capsule (10 mg total) by mouth 3 (three) times daily as needed for up to 14 days for spasms. or abdominal pain 10/04/17 08/30/20  Hinda Kehr, MD    Physical Exam   Triage Vital Signs: ED Triage Vitals  Enc Vitals Group     BP 02/05/22 2256 (!) 124/99     Pulse Rate 02/05/22 2256 (!) 118     Resp 02/05/22 2256 20     Temp 02/05/22 2256 98.6 F (37 C)     Temp Source 02/05/22 2256 Oral     SpO2 02/05/22 2256 99 %     Weight 02/05/22 2259 140 lb (63.5 kg)     Height 02/05/22 2259 '5\' 5"'$  (1.651 m)     Head Circumference --      Peak Flow --      Pain Score 02/05/22 2258 8     Pain Loc --      Pain Edu? --      Excl. in [place]? --      Most recent vital signs: Vitals:   02/06/22 0430 02/06/22 0530  BP: (!) 130/96 104/74  Pulse: 98 74  Resp: (!) 24 12  Temp:    SpO2: 98% 97%    CONSTITUTIONAL: Alert and oriented and responds appropriately to questions.  Thin, chronically ill-appearing HEAD: Normocephalic, atraumatic EYES: Conjunctivae clear, pupils appear equal, sclera nonicteric ENT: normal nose; dry mucous membranes, mild posterior pharyngeal erythema noted, no tonsillar hypertrophy or exudate, no uvular deviation, no trismus or drooling, normal phonation NECK: Supple, normal ROM CARD: Regular and tachycardic; S1 and S2 appreciated; no murmurs, no clicks, no rubs, no gallops RESP: Normal chest excursion without splinting or tachypnea; breath sounds clear and equal bilaterally; no wheezes, no rhonchi, no rales, no hypoxia or respiratory distress, speaking full sentences ABD/GI: Normal bowel sounds; non-distended; soft, non-tender, no rebound, no guarding, no peritoneal signs BACK: The back appears normal EXT: Normal ROM in all joints; no deformity noted, no edema; no cyanosis SKIN: Normal color for age and race; warm; no rash on exposed skin NEURO: Moves all extremities equally, normal speech, normal sensation diffusely, no facial asymmetry PSYCH: Tearful.   ED Results / Procedures / Treatments   LABS: (all labs ordered are listed, but only abnormal results are displayed) Labs Reviewed  BASIC METABOLIC PANEL - Abnormal; Notable for the following components:      Result Value   Potassium 3.0 (*)    Glucose, Bld 156 (*)    Creatinine, Ser 1.05 (*)    All other components within normal limits  URINALYSIS, ROUTINE W REFLEX MICROSCOPIC - Abnormal; Notable for the following components:   Color, Urine YELLOW (*)    APPearance HAZY (*)    Ketones, ur 5 (*)    All other components within normal limits  HEPATIC FUNCTION PANEL - Abnormal; Notable for the following components:   Total Protein 8.2 (*)    All  other components within normal limits  URINE DRUG SCREEN, QUALITATIVE [facility]  ONLY) - Abnormal; Notable for the following components:   Amphetamines, Ur Screen POSITIVE (*)    Opiate, Ur Screen POSITIVE (*)    Cannabinoid 50 Ng, Ur [place] POSITIVE (*)    Benzodiazepine, Ur Scrn POSITIVE (*)    All other components within normal limits  GROUP A STREP BY PCR  SARS CORONAVIRUS 2 BY RT PCR  CBC  ETHANOL  MAGNESIUM  D-DIMER, QUANTITATIVE (NOT AT [facility])  TSH  PREGNANCY, URINE  CBG MONITORING, ED  TROPONIN I (HIGH SENSITIVITY)  TROPONIN I (HIGH SENSITIVITY)     EKG:  EKG Interpretation  Date/Time:  Wednesday February 05 2022 22:59:17 EDT Ventricular Rate:  115 PR Interval:  166 QRS Duration: 87 QT Interval:  349 QTC Calculation: 483 R Axis:   28 Text Interpretation: Sinus tachycardia Aberrant complex LAE, consider biatrial enlargement RSR' in V1 or V2, right VCD or RVH Nonspecific repol abnormality, lateral leads No significant change since last tracing Confirmed by Pryor Curia [phone]) on 02/05/2022 11:10:34 PM         RADIOLOGY: My personal review and interpretation of imaging: Chest x-ray clear.  I have personally reviewed all radiology reports.   DG Chest 1 View  Result Date: 02/05/2022 CLINICAL DATA:  Seizure EXAM: CHEST  1 VIEW COMPARISON:  09/20/2021 FINDINGS: The heart size and mediastinal contours are within normal limits. Both lungs are clear. The visualized skeletal structures are unremarkable. IMPRESSION: No active disease. Electronically Signed   By: Fidela Salisbury M.D.   On: 02/05/2022 23:59     PROCEDURES:  Critical Care performed: No     .1-3 Lead EKG Interpretation  Performed by: Chong Wojdyla, Delice Bison, DO Authorized by: Nakoma Gotwalt, Delice Bison, DO     Interpretation: abnormal     ECG rate:  118   ECG rate assessment: tachycardic     Rhythm: sinus tachycardia     Ectopy: none     Conduction: normal       IMPRESSION / MDM / ASSESSMENT AND PLAN / ED COURSE  I  reviewed the triage vital signs and the nursing notes.    Patient here with episode of syncope versus seizure.  The patient is on the cardiac monitor to evaluate for evidence of arrhythmia and/or significant heart rate changes.   DIFFERENTIAL DIAGNOSIS (includes but not limited to):   Syncope, panic attack, seizure, withdrawal, anemia, electrolyte derangement, PE, pneumonia, pneumothorax, low suspicion for ACS, CHF, dissection   Patient's presentation is most consistent with acute presentation with potential threat to life or bodily function.   PLAN: We will obtain CBC, CMP, troponin x2, ethanol level, urine drug screen, D-dimer, chest x-ray.  Will give IV fluids, Toradol, Ativan.   MEDICATIONS GIVEN IN ED: Medications  ketorolac (TORADOL) 30 MG/ML injection 30 mg (30 mg Intravenous Given 02/05/22 2327)  LORazepam (ATIVAN) injection 1 mg (1 mg Intravenous Given 02/05/22 2328)  sodium chloride 0.9 % bolus 1,000 mL (0 mLs Intravenous Stopped 02/06/22 0132)  potassium chloride SA (KLOR-CON M) CR tablet 40 mEq (40 mEq Oral Given 02/06/22 0023)  ondansetron (ZOFRAN) injection 4 mg (4 mg Intravenous Given 02/06/22 0132)  sodium chloride 0.9 % bolus 1,000 mL (0 mLs Intravenous Stopped 02/06/22 0616)     ED COURSE: Patient's heart rate has improved after fluids and Ativan.  No leukocytosis.  Normal hemoglobin.  Potassium is 3.0.  Given replacement.  Magnesium normal.  TSH normal.  Troponin x2 negative.  Alcohol level negative.  D-dimer negative.  Urine drug screen positive for  opiates, benzodiazepines and amphetamines.  She is prescribed Suboxone, Xanax and Adderall.  It is also positive for cannabinoids.  Urine shows no sign of infection.  Pregnancy test is negative.  I feel patient is safe to go home.  No seizure-like activity witnessed here.  No arrhythmia on cardiac monitoring.  Recommended close follow-up with her PCP.  I suspect that her loss of consciousness may have been due to hyperventilation from  her panic attack.   At this time, I do not feel there is any life-threatening condition present. I reviewed all nursing notes, vitals, pertinent previous records.  All lab and urine results, EKGs, imaging ordered have been independently reviewed and interpreted by myself.  I reviewed all available radiology reports from any imaging ordered this visit.  Based on my assessment, I feel the patient is safe to be discharged home without further emergent workup and can continue workup as an outpatient as needed. Discussed all findings, treatment plan as well as usual and customary return precautions.  They verbalize understanding and are comfortable with this plan.  Outpatient follow-up has been provided as needed.  All questions have been answered.    CONSULTS: Admission considered but patient's work-up is reassuring and she is requesting discharge home.  I feel this is appropriate and she can follow-up with her PCP for further outpatient management.   OUTSIDE RECORDS REVIEWED: Reviewed patient's last neurology note on 12/05/2020 with Luella Cook.  MRI brain at that time was unremarkable.       FINAL CLINICAL IMPRESSION(S) / ED DIAGNOSES   Final diagnoses:  Loss of consciousness ([place])  Chest pain, unspecified type  Hypokalemia     Rx / DC Orders   ED Discharge Orders     None        Note:  This document was prepared using Dragon voice recognition software and may include unintentional dictation errors.   Chawn Spraggins, Delice Bison, DO 02/06/22 [phone]

## 2022-02-05 NOTE — ED Triage Notes (Addendum)
Pt comes from friends house via [place]. Pt thinks she has a seizure. Pt reports getting upset and going into car when she started to hyperventilate but doesn't remember anything after that. Per EMS, pt HR was high (around 148). Pt has hx of seizures but does not take any medication.  Pt reports taking half a xanax 6hrs ago Per EMS pt was complaining of chest tightness, '325mg'$  aspirin given by EMS 110 HR 130/80  98% RA 22 RR

## 2022-02-06 LAB — URINALYSIS, ROUTINE W REFLEX MICROSCOPIC
Bilirubin Urine: NEGATIVE
Glucose, UA: NEGATIVE mg/dL
Hgb urine dipstick: NEGATIVE
Ketones, ur: 5 mg/dL — AB
Leukocytes,Ua: NEGATIVE
Nitrite: NEGATIVE
Protein, ur: NEGATIVE mg/dL
Specific Gravity, Urine: 1.006 (ref 1.005–1.030)
pH: 6 (ref 5.0–8.0)

## 2022-02-06 LAB — URINE DRUG SCREEN, QUALITATIVE (ARMC ONLY)
Amphetamines, Ur Screen: POSITIVE — AB
Barbiturates, Ur Screen: NOT DETECTED
Benzodiazepine, Ur Scrn: POSITIVE — AB
Cannabinoid 50 Ng, Ur ~~LOC~~: POSITIVE — AB
Cocaine Metabolite,Ur ~~LOC~~: NOT DETECTED
MDMA (Ecstasy)Ur Screen: NOT DETECTED
Methadone Scn, Ur: NOT DETECTED
Opiate, Ur Screen: POSITIVE — AB
Phencyclidine (PCP) Ur S: NOT DETECTED
Tricyclic, Ur Screen: NOT DETECTED

## 2022-02-06 LAB — HEPATIC FUNCTION PANEL
ALT: 14 U/L (ref 0–44)
AST: 28 U/L (ref 15–41)
Albumin: 4.7 g/dL (ref 3.5–5.0)
Alkaline Phosphatase: 71 U/L (ref 38–126)
Bilirubin, Direct: <0.1 mg/dL (ref 0.0–0.2)
Total Bilirubin: 0.8 mg/dL (ref 0.3–1.2)
Total Protein: 8.2 g/dL — ABNORMAL HIGH (ref 6.5–8.1)

## 2022-02-06 LAB — ETHANOL: Alcohol, Ethyl (B): <10 mg/dL (ref <10)

## 2022-02-06 LAB — TSH: TSH: 1.048 u[IU]/mL (ref 0.350–4.500)

## 2022-02-06 LAB — D-DIMER, QUANTITATIVE: D-Dimer, Quant: 0.36 ug/mL-FEU (ref 0.00–0.50)

## 2022-02-06 LAB — TROPONIN I (HIGH SENSITIVITY): Troponin I (High Sensitivity): 8 ng/L (ref <18)

## 2022-02-06 LAB — PREGNANCY, URINE: Preg Test, Ur: NEGATIVE

## 2022-02-06 LAB — MAGNESIUM: Magnesium: 2.2 mg/dL (ref 1.7–2.4)

## 2022-02-06 LAB — GROUP A STREP BY PCR: Group A Strep by PCR: NOT DETECTED

## 2022-02-06 LAB — SARS CORONAVIRUS 2 BY RT PCR: SARS Coronavirus 2 by RT PCR: NEGATIVE

## 2022-02-06 MED ORDER — SODIUM CHLORIDE 0.9 % IV BOLUS (SEPSIS)
1000.0000 mL | Freq: Once | INTRAVENOUS | Status: AC
  Administered 2022-02-06: 1000 mL via INTRAVENOUS

## 2022-02-06 MED ORDER — ONDANSETRON HCL 4 MG/2ML IJ SOLN
4.0000 mg | Freq: Once | INTRAMUSCULAR | Status: AC
  Administered 2022-02-06: 4 mg via INTRAVENOUS
  Filled 2022-02-06: qty 2

## 2022-02-06 MED ORDER — POTASSIUM CHLORIDE CRYS ER 20 MEQ PO TBCR
40.0000 meq | EXTENDED_RELEASE_TABLET | Freq: Once | ORAL | Status: AC
  Administered 2022-02-06: 40 meq via ORAL
  Filled 2022-02-06: qty 2

## 2022-02-06 NOTE — Discharge Instructions (Addendum)
Your work-up today has been reassuring.  Your cardiac labs were negative.  Your EKG showed a fast heart rate which improved with Ativan and IV fluids.  Blood test showed no sign of blood clot.  Chest x-ray was clear.  You had no abnormal heart rhythm seen on cardiac monitoring here.  Recommend close follow-up with your primary care doctor.  Please continue your medications as prescribed.

## 2022-05-30 ENCOUNTER — Other Ambulatory Visit: Payer: Self-pay

## 2022-05-30 ENCOUNTER — Emergency Department
Admission: EM | Admit: 2022-05-30 | Discharge: 2022-05-30 | Disposition: A | Discharge status: Home / Self Care | Payer: Medicaid Other | Attending: Emergency Medicine | Admitting: Emergency Medicine

## 2022-05-30 ENCOUNTER — Telehealth: Payer: Self-pay | Admitting: Emergency Medicine

## 2022-05-30 DIAGNOSIS — R569 Unspecified convulsions: Secondary | ICD-10-CM | POA: Insufficient documentation

## 2022-05-30 DIAGNOSIS — F410 Panic disorder [episodic paroxysmal anxiety] without agoraphobia: Secondary | ICD-10-CM | POA: Insufficient documentation

## 2022-05-30 DIAGNOSIS — F419 Anxiety disorder, unspecified: Secondary | ICD-10-CM | POA: Insufficient documentation

## 2022-05-30 LAB — BASIC METABOLIC PANEL
Anion gap: 9 (ref 5–15)
BUN: 9 mg/dL (ref 6–20)
CO2: 27 mmol/L (ref 22–32)
Calcium: 9.1 mg/dL (ref 8.9–10.3)
Chloride: 104 mmol/L (ref 98–111)
Creatinine, Ser: 0.65 mg/dL (ref 0.44–1.00)
GFR, Estimated: >60 mL/min (ref >60)
Glucose, Bld: 111 mg/dL — ABNORMAL HIGH (ref 70–99)
Potassium: 2.7 mmol/L — CL (ref 3.5–5.1)
Sodium: 140 mmol/L (ref 135–145)

## 2022-05-30 LAB — CBC WITH DIFFERENTIAL/PLATELET
Abs Immature Granulocytes: 0.02 10*3/uL (ref 0.00–0.07)
Basophils Absolute: 0.1 10*3/uL (ref 0.0–0.1)
Basophils Relative: 1 %
Eosinophils Absolute: 0.2 10*3/uL (ref 0.0–0.5)
Eosinophils Relative: 3 %
HCT: 41.7 % (ref 36.0–46.0)
Hemoglobin: 13.6 g/dL (ref 12.0–15.0)
Immature Granulocytes: 0 %
Lymphocytes Relative: 38 %
Lymphs Abs: 2.2 10*3/uL (ref 0.7–4.0)
MCH: 28.8 pg (ref 26.0–34.0)
MCHC: 32.6 g/dL (ref 30.0–36.0)
MCV: 88.2 fL (ref 80.0–100.0)
Monocytes Absolute: 0.4 10*3/uL (ref 0.1–1.0)
Monocytes Relative: 6 %
Neutro Abs: 3 10*3/uL (ref 1.7–7.7)
Neutrophils Relative %: 52 %
Platelets: 301 10*3/uL (ref 150–400)
RBC: 4.73 MIL/uL (ref 3.87–5.11)
RDW: 12.6 % (ref 11.5–15.5)
WBC: 5.9 10*3/uL (ref 4.0–10.5)
nRBC: 0 % (ref 0.0–0.2)

## 2022-05-30 NOTE — ED Triage Notes (Signed)
Pt to ED via [facility] for seizure. Pt was in the car with her dad and was on the way to pick her boy friend up when she had a full body seizure that lasted 5 minutes per her father. EMS reports that pt was post ictal when they arrived. Pt states that she is not on seizure medication but she has a hx/o seizures when she is off of her Xanax. Pt states that she has taken Xanax for the last 12 years PRN. Pt states that she last took Xanax 2 days ago. Pt is in NAD at this time

## 2022-05-30 NOTE — ED Provider Notes (Signed)
[facility] Provider Note    Event Date/Time   First MD Initiated Contact with Patient 05/30/22 1146     (approximate)   History   Seizure  HPI  Summer Buckley is a 36 y.o. female extensive medical history including anxiety, polysubstance abuse,  Patient reports that she was driving with her dad she has been under a lot of stress lately she was feeling very anxious and that she was feeling panicked.  Normally she takes her alprazolam and she reports she has a prescribed to her, but she had a "severe panic attack"  Her father reports that she put her arms across her chest began shaking violently and foaming at the mouth.  It lasted several minutes.  There were 2 she came to.  Patient reports she has had the same symptoms several times in the past especially over the last year and has been revealed as severe panic attacks.  She denies that she actually has a true history of seizures, but reports that when she is extremely stressed these episodes will occur.  She has not yet seen a neurologist for them.  She does take Xanax, and has it prescribed her 3 times a day, sometimes she does not need to take it 3 times a day.  She reports she last had to use it about a day ago and has an ample prescription  No falls or injuries did not bite her tongue did not urinate on herself no headache.  She came to was conversant remembers driving in the car feeling very anxious and the episode occurring    Patient voices that she does not want to stay for evaluation or further labs or testing.  She reports she is feels perfectly fine this abdomen many times, it is related to panic attacks and she wished to be discharged.  We did discuss and I offered her further treatment including checking labs observation and further workup and testing, but patient is fully alert well-oriented in no distress with clear mind and capacity and request to be discharged.  She is agreeable to not  driving a car or putting herself in dangerous situations where another episode occurred she can get injured severely.  She will return to the ER right away if another episode occurs, her father will be driving her home.  She is also agreeable to follow-up with neurologist whom I provided referral info for   Physical Exam   Triage Vital Signs: ED Triage Vitals  Enc Vitals Group     BP      Pulse      Resp      Temp      Temp src      SpO2      Weight      Height      Head Circumference      Peak Flow      Pain Score      Pain Loc      Pain Edu?      Excl. in [place]?     Most recent vital signs: Vitals:   05/30/22 1155  BP: 121/79  Pulse: 85  Resp: 16  Temp: 98.5 F (36.9 C)  SpO2: 100%     General: Awake, no distress.  Very pleasant. CV:  Good peripheral perfusion.  Normal tones and rate Resp:  Normal effort.  Clear lungs bilaterally Abd:  No distention.  Soft nontender nondistended Other:  Normal smile.  Extraocular movements are normal.  Normocephalic atraumatic.  Moves all extremities without difficulty or deficit.   ED Results / Procedures / Treatments   Labs (all labs ordered are listed, but only abnormal results are displayed) Labs Reviewed  CBC WITH DIFFERENTIAL/PLATELET  BASIC METABOLIC PANEL     EKG     RADIOLOGY  Reviewed previous imaging in April of this year she had a CT of the head with it was normal   PROCEDURES:  Critical Care performed: No  Procedures   MEDICATIONS ORDERED IN ED: Medications - No data to display   IMPRESSION / MDM / [place] / ED COURSE  I reviewed the triage vital signs and the nursing notes.                              Differential diagnosis includes, but is not limited to, possible seizure, panic attack pseudoseizure anxiety, electrolyte abnormality dehydration etc. all considered.  Based on the clinical history provided the patient's previous medical history and her description as well as her  father's I believe she probably had a nonepileptiform or pseudoseizure type event or possibly a severe panic attack.  She does not appear to have an obvious organic seizure disorder.  She also reports as needed use of alprazolam and reports she has not run out and she shows no tachycardia no hypertension no evidence that would suggest acute withdrawal    Patient's presentation is most consistent with acute complicated illness / injury requiring diagnostic workup.    Patient declined further treatment.  I will discharge her, with shared medical decision making we discussed reasons for further evaluation including evaluation for other physiologic causes of her seizure such as electrolyte problems, potential drug-induced seizure, etc. but patient declines further treatment and elects that she would like to be discharged and will return if another episode occurs or other ongoing concerns.  Appropriate for ongoing outpatient treatment follow-up with neurology  Additionally she advises she will reach out to her primary prescriber of her medications and see if she may need additional medication changes related to the severe anxiety that she reports and high stress levels in her life as of recent      FINAL CLINICAL IMPRESSION(S) / ED DIAGNOSES   Final diagnoses:  Seizure-like activity ([place])     Rx / DC Orders   ED Discharge Orders     None        Note:  This document was prepared using Dragon voice recognition software and may include unintentional dictation errors.   Delman Kitten, MD 05/30/22 1215

## 2022-05-30 NOTE — Telephone Encounter (Signed)
Lab called this RN with critical lab result. Patients Potassium was 2.7. Dr. Jacqualine Code informed. Awaiting response.

## 2022-05-30 NOTE — Discharge Instructions (Addendum)
Please follow-up closely with neurology.  Do not drive a car, operate heavy machinery or put yourself in a position where you could become injured if you have another seizure-like episode  Please return to the ER right away if you have another seizure or other new concerns arise  Continue to utilize your prescriptions including your alprazolam, as prescribed by your doctor

## 2022-05-30 NOTE — ED Notes (Signed)
Family at bedside. 

## 2022-05-30 NOTE — ED Notes (Signed)
Pt given cup to get urine sample, unable to urinate at this time

## 2022-06-03 NOTE — Telephone Encounter (Signed)
Called patient due to left emergency department before provider exam to inquire about condition and follow up plans. She  had not looked at lab results.  Explained potassium lower than it has been.  Says she has been taking some ?otc potassium 500 mg.  I asked her to call pcp and let them know what she is taking to see if that is adequate.  I also told her that pcp would likely want to recheck the potasium.

## 2022-11-26 ENCOUNTER — Ambulatory Visit
Admission: RE | Admit: 2022-11-26 | Discharge: 2022-11-26 | Disposition: A | Discharge status: Home / Self Care | Payer: Medicaid Other | Source: Ambulatory Visit | Attending: Family Medicine | Admitting: Family Medicine

## 2022-11-26 ENCOUNTER — Ambulatory Visit
Admission: RE | Admit: 2022-11-26 | Discharge: 2022-11-26 | Disposition: A | Discharge status: Home / Self Care | Payer: Medicaid Other | Attending: Family Medicine | Admitting: Family Medicine

## 2022-11-26 ENCOUNTER — Other Ambulatory Visit: Payer: Self-pay | Admitting: Family Medicine

## 2022-11-26 DIAGNOSIS — M25571 Pain in right ankle and joints of right foot: Secondary | ICD-10-CM

## 2023-03-25 ENCOUNTER — Emergency Department: Payer: Medicaid Other

## 2023-03-25 ENCOUNTER — Emergency Department
Admission: EM | Admit: 2023-03-25 | Discharge: 2023-03-25 | Disposition: A | Discharge status: Home / Self Care | Payer: Medicaid Other | Attending: Student in an Organized Health Care Education/Training Program | Admitting: Student in an Organized Health Care Education/Training Program

## 2023-03-25 ENCOUNTER — Other Ambulatory Visit: Payer: Self-pay

## 2023-03-25 DIAGNOSIS — R569 Unspecified convulsions: Secondary | ICD-10-CM | POA: Insufficient documentation

## 2023-03-25 LAB — CBC WITH DIFFERENTIAL/PLATELET
Abs Immature Granulocytes: 0.01 10*3/uL (ref 0.00–0.07)
Basophils Absolute: 0.1 10*3/uL (ref 0.0–0.1)
Basophils Relative: 1 %
Eosinophils Absolute: 0.2 10*3/uL (ref 0.0–0.5)
Eosinophils Relative: 4 %
HCT: 45.1 % (ref 36.0–46.0)
Hemoglobin: 15.1 g/dL — ABNORMAL HIGH (ref 12.0–15.0)
Immature Granulocytes: 0 %
Lymphocytes Relative: 37 %
Lymphs Abs: 1.7 10*3/uL (ref 0.7–4.0)
MCH: 28.7 pg (ref 26.0–34.0)
MCHC: 33.5 g/dL (ref 30.0–36.0)
MCV: 85.6 fL (ref 80.0–100.0)
Monocytes Absolute: 0.3 10*3/uL (ref 0.1–1.0)
Monocytes Relative: 6 %
Neutro Abs: 2.4 10*3/uL (ref 1.7–7.7)
Neutrophils Relative %: 52 %
Platelets: 272 10*3/uL (ref 150–400)
RBC: 5.27 MIL/uL — ABNORMAL HIGH (ref 3.87–5.11)
RDW: 12.5 % (ref 11.5–15.5)
WBC: 4.7 10*3/uL (ref 4.0–10.5)
nRBC: 0 % (ref 0.0–0.2)

## 2023-03-25 LAB — COMPREHENSIVE METABOLIC PANEL
ALT: 12 U/L (ref 0–44)
AST: 27 U/L (ref 15–41)
Albumin: 4.5 g/dL (ref 3.5–5.0)
Alkaline Phosphatase: 54 U/L (ref 38–126)
Anion gap: 9 (ref 5–15)
BUN: 9 mg/dL (ref 6–20)
CO2: 21 mmol/L — ABNORMAL LOW (ref 22–32)
Calcium: 9.1 mg/dL (ref 8.9–10.3)
Chloride: 104 mmol/L (ref 98–111)
Creatinine, Ser: 0.78 mg/dL (ref 0.44–1.00)
GFR, Estimated: >60 mL/min (ref >60)
Glucose, Bld: 115 mg/dL — ABNORMAL HIGH (ref 70–99)
Potassium: 3.3 mmol/L — ABNORMAL LOW (ref 3.5–5.1)
Sodium: 134 mmol/L — ABNORMAL LOW (ref 135–145)
Total Bilirubin: 0.9 mg/dL (ref 0.3–1.2)
Total Protein: 8.1 g/dL (ref 6.5–8.1)

## 2023-03-25 LAB — URINE DRUG SCREEN, QUALITATIVE (ARMC ONLY)
Amphetamines, Ur Screen: NOT DETECTED
Barbiturates, Ur Screen: NOT DETECTED
Benzodiazepine, Ur Scrn: POSITIVE — AB
Cannabinoid 50 Ng, Ur ~~LOC~~: NOT DETECTED
Cocaine Metabolite,Ur ~~LOC~~: NOT DETECTED
MDMA (Ecstasy)Ur Screen: NOT DETECTED
Methadone Scn, Ur: NOT DETECTED
Opiate, Ur Screen: NOT DETECTED
Phencyclidine (PCP) Ur S: NOT DETECTED
Tricyclic, Ur Screen: NOT DETECTED

## 2023-03-25 MED ORDER — CELECOXIB 100 MG PO CAPS: 100.0000 mg | ORAL_CAPSULE | Freq: Every day | ORAL | 2 refills | Status: AC

## 2023-03-25 MED ORDER — ACETAMINOPHEN 500 MG PO TABS: 1000.0000 mg | ORAL_TABLET | Freq: Once | ORAL | Status: DC

## 2023-03-25 MED ORDER — PREGABALIN 25 MG PO CAPS: 25.0000 mg | ORAL_CAPSULE | Freq: Three times a day (TID) | ORAL | 1 refills | Status: AC

## 2023-03-25 MED ORDER — LORAZEPAM 2 MG/ML IJ SOLN
INTRAMUSCULAR | Status: AC
  Administered 2023-03-25: 2 mg via INTRAVENOUS
  Filled 2023-03-25: qty 1

## 2023-03-25 MED ORDER — LORAZEPAM 2 MG/ML IJ SOLN: 2.0000 mg | Freq: Once | INTRAMUSCULAR | Status: AC

## 2023-03-25 MED ORDER — TOPIRAMATE 50 MG PO TABS: 50.0000 mg | ORAL_TABLET | Freq: Every day | ORAL | 2 refills | Status: AC

## 2023-03-25 MED ORDER — METOCLOPRAMIDE HCL 5 MG/ML IJ SOLN: 10.0000 mg | Freq: Once | INTRAMUSCULAR | Status: DC

## 2023-03-25 MED ORDER — DIPHENHYDRAMINE HCL 50 MG/ML IJ SOLN: 12.5000 mg | Freq: Once | INTRAMUSCULAR | Status: DC

## 2023-03-25 NOTE — ED Provider Notes (Signed)
[facility] Provider Note    Event Date/Time   First MD Initiated Contact with Patient 03/25/23 [phone]     (approximate)   History   Seizures   HPI  Summer Buckley is a 37 y.o. female who presents to the ER for evaluation of seizure-like episode.  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  She states that this morning she was feeling anxious and then started twitching.  Never fully lost consciousness but was shaking all over.  Does complain of severe headache right now.  Not sudden onset no fevers or chills.     Physical Exam   Triage Vital Signs: ED Triage Vitals  Encounter Vitals Group     BP      Systolic BP Percentile      Diastolic BP Percentile      Pulse      Resp      Temp      Temp src      SpO2      Weight      Height      Head Circumference      Peak Flow      Pain Score      Pain Loc      Pain Education      Exclude from Growth Chart     Most recent vital signs: Vitals:   03/25/23 1130 03/25/23 1200  BP: 117/85 (!) 120/92  Pulse: 79 93  Resp: 12 17  Temp:    SpO2: 97% 99%     Constitutional: Alert  Eyes: Conjunctivae are normal.  Head: Atraumatic. Nose: No congestion/rhinnorhea. Mouth/Throat: Mucous membranes are moist.   Neck: Painless ROM.  Cardiovascular:   Good peripheral circulation. Respiratory: Normal respiratory effort.  No retractions.  Gastrointestinal: Soft and nontender.  Musculoskeletal:  no deformity Neurologic:  MAE spontaneously. No gross focal neurologic deficits are appreciated.  Skin:  Skin is warm, dry and intact. No rash noted. Psychiatric: Mood and affect are normal. Speech and behavior are normal.    ED Results / Procedures / Treatments   Labs (all labs ordered are listed, but only abnormal results are displayed) Labs Reviewed  CBC WITH DIFFERENTIAL/PLATELET - Abnormal; Notable for the following components:      Result Value   RBC  5.27 (*)    Hemoglobin 15.1 (*)    All other components within normal limits  COMPREHENSIVE METABOLIC PANEL - Abnormal; Notable for the following components:   Sodium 134 (*)    Potassium 3.3 (*)    CO2 21 (*)    Glucose, Bld 115 (*)    All other components within normal limits  URINE DRUG SCREEN, QUALITATIVE ([facility] ONLY) - Abnormal; Notable for the following components:   Benzodiazepine, Ur Scrn POSITIVE (*)    All other components within normal limits     EKG  ED ECG REPORT I, Willy Eddy, the attending physician, personally viewed and interpreted this ECG.   Date: 03/25/2023  EKG Time: 9:32  Rate: 125  Rhythm: sinus  Axis: normal  Intervals: normal  ST&T Change: nonspecific st abn,     RADIOLOGY Please see ED Course for my review and interpretation.  I personally reviewed all radiographic images ordered to evaluate for the above acute complaints and reviewed radiology reports and findings.  These findings were personally discussed with the patient.  Please see medical record for radiology report.    PROCEDURES:  Critical Care performed: No  Procedures   MEDICATIONS ORDERED IN ED: Medications  acetaminophen (TYLENOL) tablet 1,000 mg (has no administration in time range)  diphenhydrAMINE (BENADRYL) injection 12.5 mg (has no administration in time range)  metoCLOPramide (REGLAN) injection 10 mg (has no administration in time range)  LORazepam (ATIVAN) injection 2 mg (2 mg Intravenous Given 03/25/23 0950)     IMPRESSION / MDM / ASSESSMENT AND PLAN / ED COURSE  I reviewed the triage vital signs and the nursing notes.                              Differential diagnosis includes, but is not limited to, seizure, pseudoseizure, electrolyte abnormality, sepsis, dehydration, withdrawal, tumor, CVA  Patient presenting to the ER for evaluation of symptoms as described above.  Based on symptoms, risk factors and considered above differential, this presenting  complaint could reflect a potentially life-threatening illness therefore the patient will be placed on continuous pulse oximetry and telemetry for monitoring.  Laboratory evaluation will be sent to evaluate for the above complaints.      Clinical Course as of 03/25/23 1309  Wed Mar 25, 2023  1006 Patient had another brief episode lasting less than a minute where she was hyperventilating shaking all over.  Pupils are dilated.  Patient increasingly anxious appearing will give IV Ativan. [PR]  1028 CT head on my review and interpretation without evidence of CVA or mass. [PR]  1306 Patient reassessed.  Remains at baseline.  No additional seizure-like activity or shaking spells.  Workup is otherwise reassuring.  She does appear stable and appropriate for outpatient follow-up. [PR]    Clinical Course User Index [PR] Willy Eddy, MD     FINAL CLINICAL IMPRESSION(S) / ED DIAGNOSES   Final diagnoses:  Seizure-like activity (HCC)     Rx / DC Orders   ED Discharge Orders          Ordered    pregabalin (LYRICA) 25 MG capsule  3 times daily        03/25/23 1306    topiramate (TOPAMAX) 50 MG tablet  Daily at bedtime        03/25/23 1306    celecoxib (CELEBREX) 100 MG capsule  Daily        03/25/23 1306             Note:  This document was prepared using Dragon voice recognition software and may include unintentional dictation errors.    Willy Eddy, MD 03/25/23 1309

## 2023-03-25 NOTE — ED Notes (Signed)
Pt to CT

## 2023-03-25 NOTE — ED Notes (Signed)
Pt back from CT
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
## 2023-04-27 ENCOUNTER — Ambulatory Visit: Payer: Medicaid Other

## 2023-04-27 ENCOUNTER — Ambulatory Visit
Admission: EM | Admit: 2023-04-27 | Discharge: 2023-04-27 | Disposition: A | Discharge status: Home / Self Care | Payer: Medicaid Other | Attending: Emergency Medicine | Admitting: Emergency Medicine

## 2023-04-27 ENCOUNTER — Encounter: Payer: Self-pay | Admitting: Emergency Medicine

## 2023-04-27 DIAGNOSIS — R059 Cough, unspecified: Secondary | ICD-10-CM | POA: Diagnosis not present

## 2023-04-27 DIAGNOSIS — R0602 Shortness of breath: Secondary | ICD-10-CM | POA: Insufficient documentation

## 2023-04-27 DIAGNOSIS — J069 Acute upper respiratory infection, unspecified: Secondary | ICD-10-CM | POA: Insufficient documentation

## 2023-04-27 DIAGNOSIS — B9789 Other viral agents as the cause of diseases classified elsewhere: Secondary | ICD-10-CM | POA: Diagnosis not present

## 2023-04-27 LAB — GROUP A STREP BY PCR: Group A Strep by PCR: NOT DETECTED

## 2023-04-27 MED ORDER — BENZONATATE 100 MG PO CAPS: 200.0000 mg | ORAL_CAPSULE | Freq: Three times a day (TID) | ORAL | 0 refills | Status: AC

## 2023-04-27 MED ORDER — IPRATROPIUM BROMIDE 0.06 % NA SOLN: 2.0000 | Freq: Four times a day (QID) | NASAL | 12 refills | Status: AC

## 2023-04-27 MED ORDER — PROMETHAZINE-DM 6.25-15 MG/5ML PO SYRP: 5.0000 mL | ORAL_SOLUTION | Freq: Four times a day (QID) | ORAL | 0 refills | Status: AC | PRN

## 2023-04-27 NOTE — Discharge Instructions (Addendum)
Your chest x-ray today did not show any evidence of pneumonia.  Your strep test was negative.  I do believe you have a viral respiratory infection which is causing your symptoms.  Please use over-the-counter Tylenol and/or ibuprofen according the package instructions as needed for any pain or fever.  You may gargle with warm salt water, or use over-the-counter Chloraseptic or Sucrets lozenges, to help with your sore throat.  Use the Atrovent nasal spray, 2 squirts up each nostril every 6 hours, as needed for runny nose, nasal congestion, and postnasal drip.  You may continue to use your albuterol inhaler as needed for any shortness of breath or wheezing.  Use the Tessalon Perles every 8 hours during the day as needed for cough.  Take them with a small sip of water.  They may give you numbness to the base of your tongue or a metallic taste in your mouth, this is normal.  Use the Promethazine DM cough syrup at bedtime as needed for cough or congestion.  Be mindful it will make you drowsy.  If you develop any new or worsening symptoms either return for reevaluation or follow-up with your primary care provider.
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
## 2023-04-27 NOTE — ED Triage Notes (Signed)
Pt presents with a cough, chest congestion, SOB and sore throat x 4 days.

## 2023-09-04 IMAGING — DX DG CHEST 1V PORT
1 series · 1 of 1 positions shown · non-contrast
Comparison: June 21, 2021

CLINICAL DATA: Status post seizure.

EXAM:
PORTABLE CHEST 1 VIEW

[chest ap]
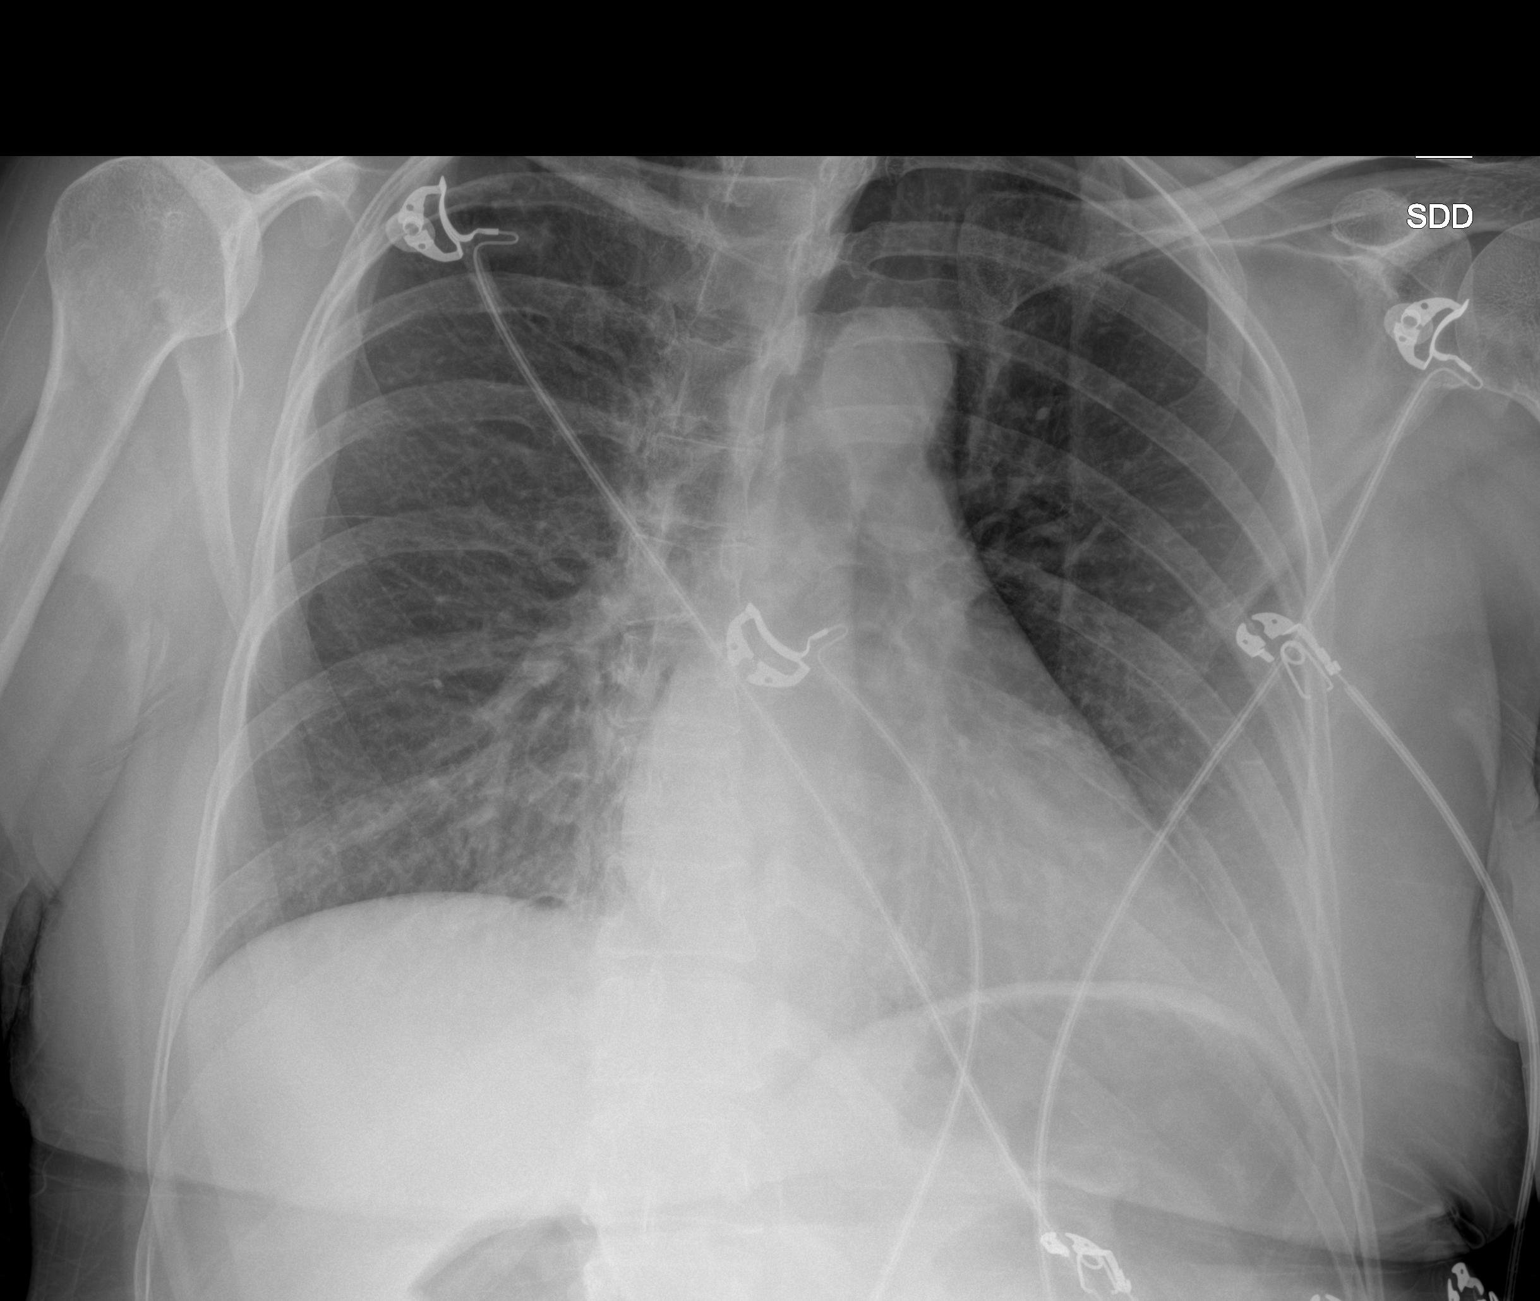

[1 of 1 positions shown; findings below may reference images not displayed]

FINDINGS: The heart size and mediastinal contours are within normal limits.
Decreased lung volumes are noted. Both lungs are clear. The
visualized skeletal structures are unremarkable.
IMPRESSION: No active disease.

## 2023-09-04 IMAGING — CT CT HEAD W/O CM
4 series · 16 of 47 positions shown, 18 images · non-contrast
Comparison: June 21, 2021

CLINICAL DATA: Seizures.



[Series 2: head bone · axial · 0.42mm/px · z∈[-213,-183]mm · 3 of 76 slices shown]
[im 8/76  bone]
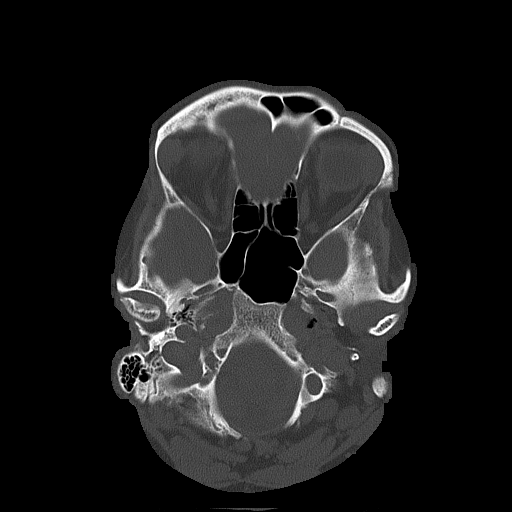
[im 16/76  bone]
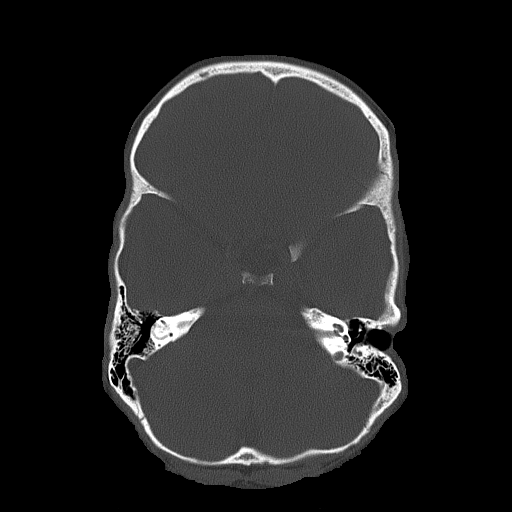
[im 23/76  bone]
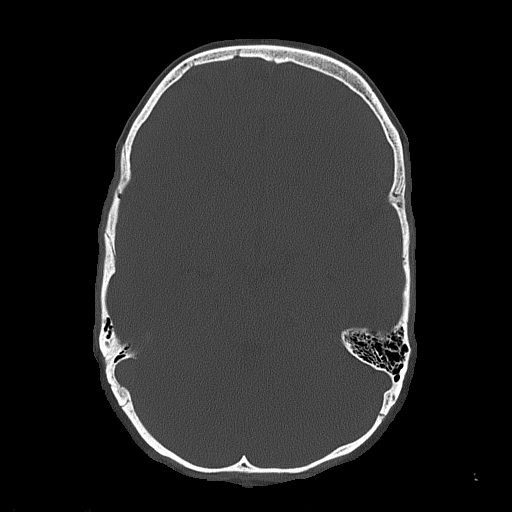

[Series 3: head wo · axial · 0.42mm/px · z∈[-212,-97]mm · 7 of 31 slices shown, 9 images]
[im 4/31  brain]
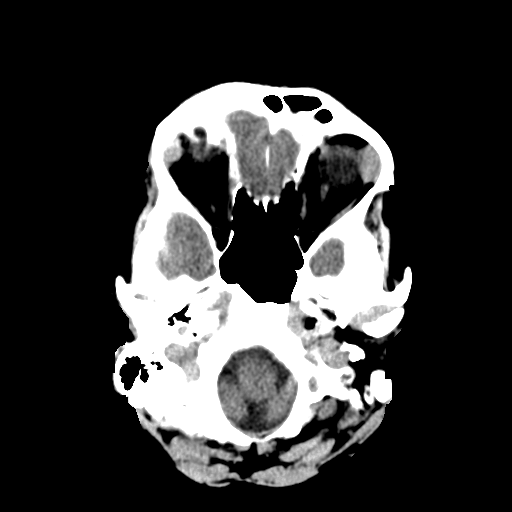
[im 4/31  bone]
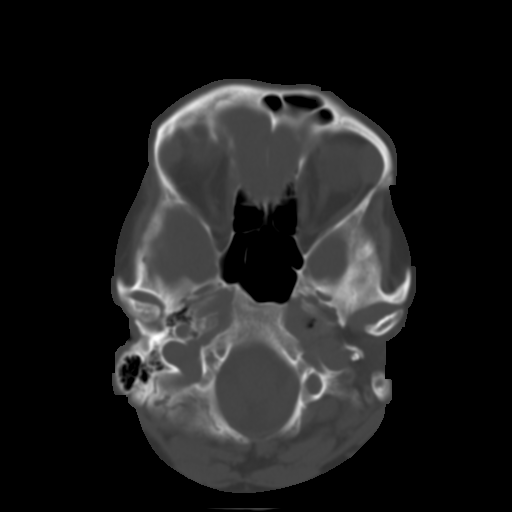
[im 8/31  brain]
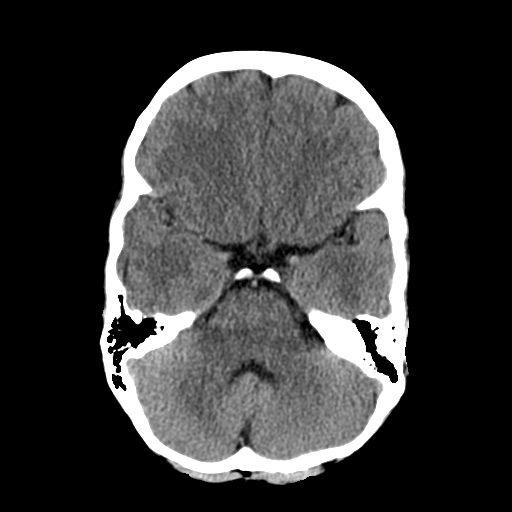
[im 12/31  brain]
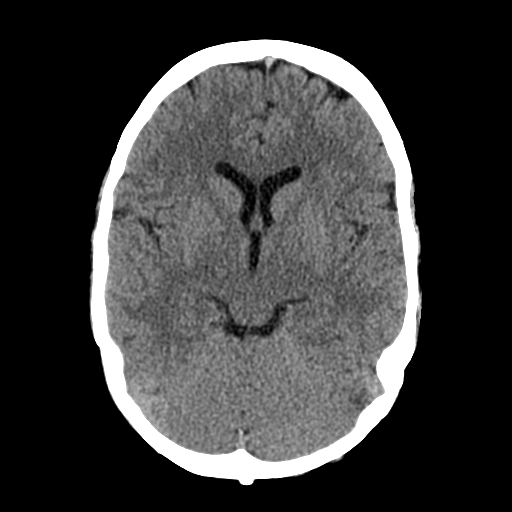
[im 16/31  brain]
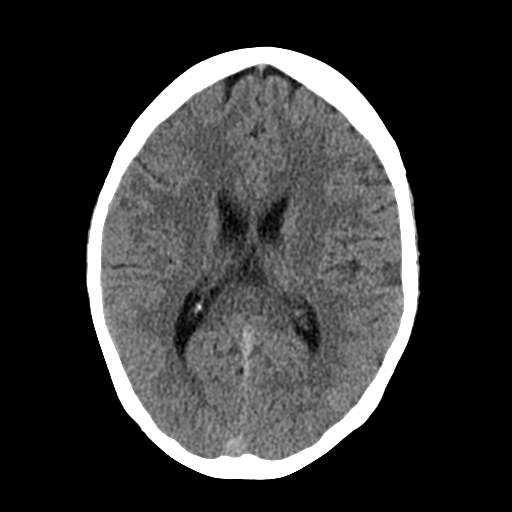
[im 19/31  brain]
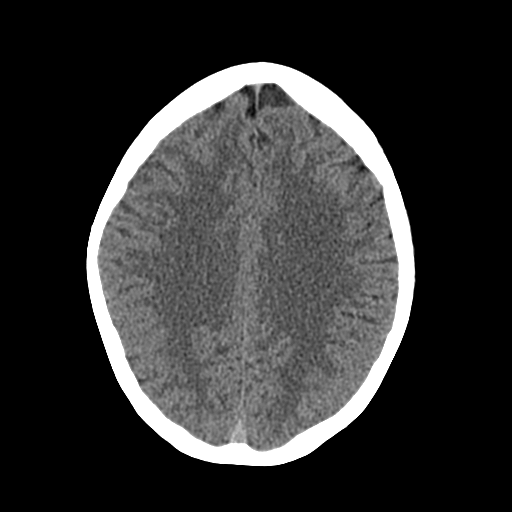
[im 19/31  bone]
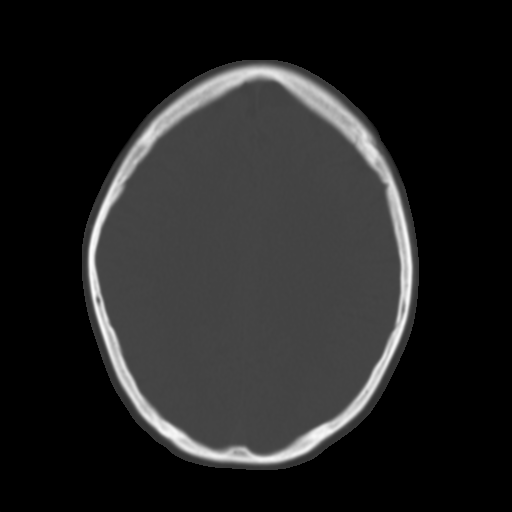
[im 23/31  brain]
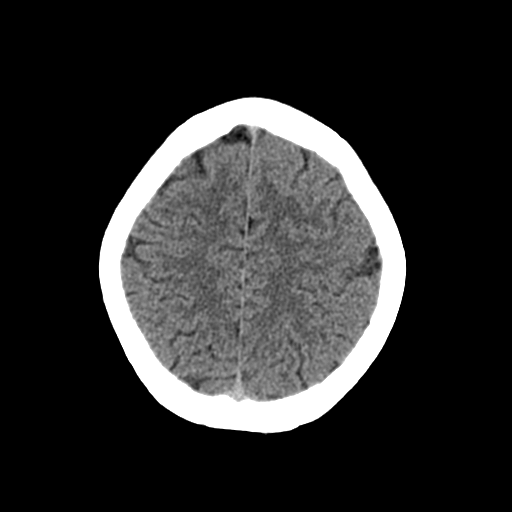
[im 27/31  brain]
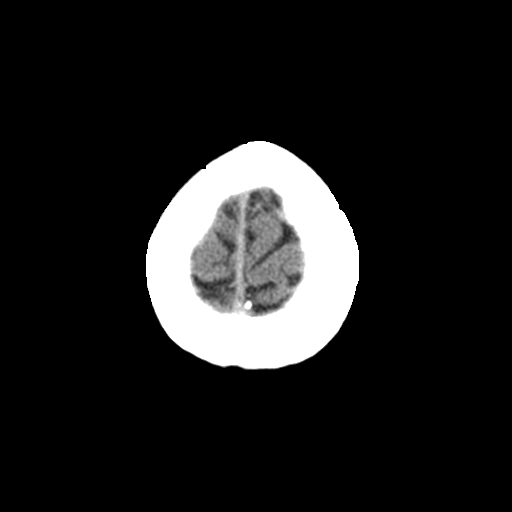

[Series 4: coronal soft tissue · coronal · 0.31mm/px · 3 of 67 slices shown]
[im 23/67  brain]
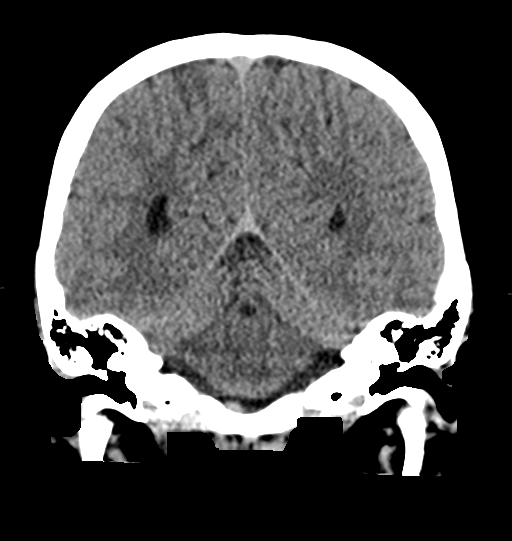
[im 30/67  brain]
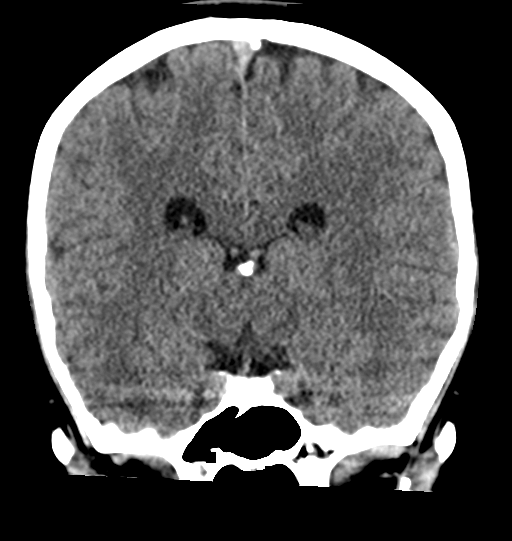
[im 37/67  brain]
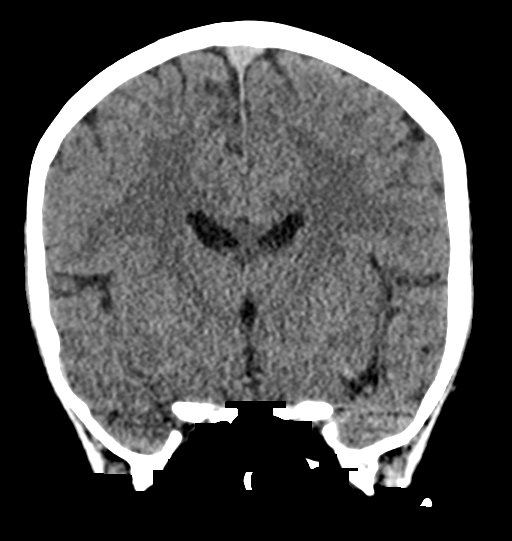

[Series 5: sagittal soft tissue · sagittal · 0.31mm/px · 3 of 53 slices shown]
[im 18/53  brain]
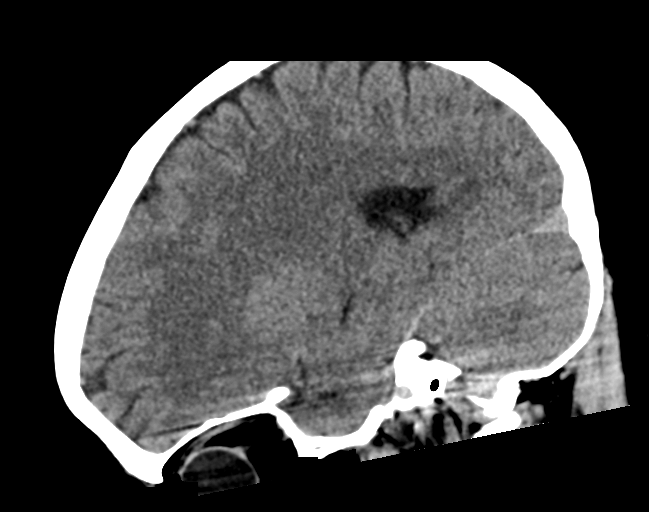
[im 27/53  brain]
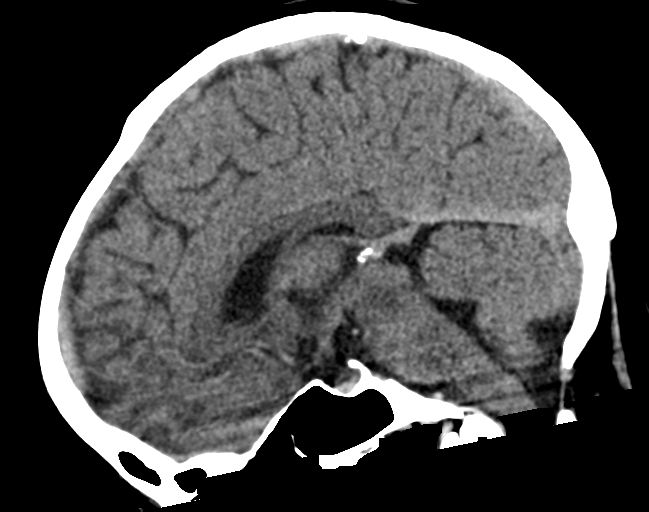
[im 35/53  brain]
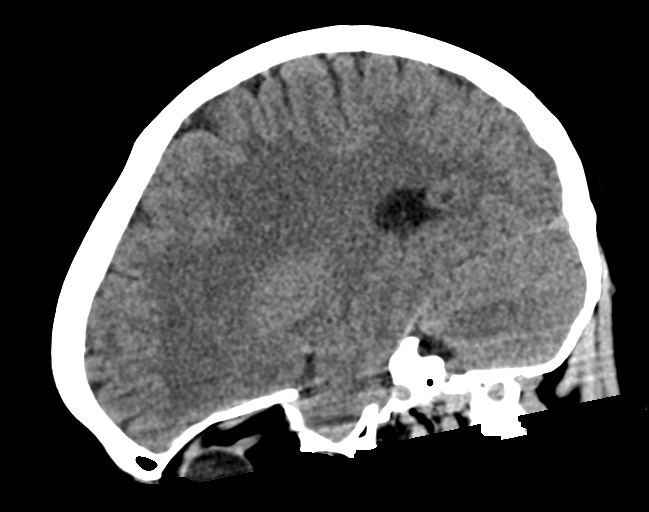

[16 of 47 positions shown; findings below may reference images not displayed]

FINDINGS: Brain: No evidence of acute infarction, hemorrhage, hydrocephalus,
extra-axial collection or mass lesion/mass effect.

Vascular: No hyperdense vessel or unexpected calcification.

Skull: Normal. Negative for fracture or focal lesion.

Sinuses/Orbits: No acute finding.

Other: None.
IMPRESSION: No acute intracranial process.

## 2023-11-03 ENCOUNTER — Inpatient Hospital Stay

## 2023-11-03 ENCOUNTER — Inpatient Hospital Stay: Attending: Oncology | Admitting: Licensed Clinical Social Worker
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
## 2023-12-31 ENCOUNTER — Ambulatory Visit: Admitting: Psychiatry

## 2024-01-01 ENCOUNTER — Telehealth: Payer: Self-pay | Admitting: Psychiatry

## 2024-01-01 NOTE — Telephone Encounter (Signed)
 PT was late for her new patient appt and needed to RS. PT stated she has been out of medication for two days. I informed her to contact her primary provider or go to nearest ED for refills to avoid withdrawal symptoms.
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
## 2024-02-01 ENCOUNTER — Encounter: Payer: Self-pay | Admitting: Emergency Medicine

## 2024-02-01 ENCOUNTER — Emergency Department

## 2024-02-01 ENCOUNTER — Other Ambulatory Visit: Payer: Self-pay

## 2024-02-01 ENCOUNTER — Emergency Department
Admission: EM | Admit: 2024-02-01 | Discharge: 2024-02-01 | Disposition: A | Discharge status: Home / Self Care | Attending: Emergency Medicine | Admitting: Emergency Medicine

## 2024-02-01 DIAGNOSIS — R519 Headache, unspecified: Secondary | ICD-10-CM | POA: Insufficient documentation

## 2024-02-01 DIAGNOSIS — W01198A Fall on same level from slipping, tripping and stumbling with subsequent striking against other object, initial encounter: Secondary | ICD-10-CM | POA: Insufficient documentation

## 2024-02-01 DIAGNOSIS — M542 Cervicalgia: Secondary | ICD-10-CM | POA: Insufficient documentation

## 2024-02-01 DIAGNOSIS — R258 Other abnormal involuntary movements: Secondary | ICD-10-CM | POA: Diagnosis present

## 2024-02-01 DIAGNOSIS — R569 Unspecified convulsions: Secondary | ICD-10-CM

## 2024-02-01 DIAGNOSIS — R892 Abnormal level of other drugs, medicaments and biological substances in specimens from other organs, systems and tissues: Secondary | ICD-10-CM | POA: Insufficient documentation

## 2024-02-01 DIAGNOSIS — E876 Hypokalemia: Secondary | ICD-10-CM | POA: Insufficient documentation

## 2024-02-01 HISTORY — DX: Unspecified intracranial injury with loss of consciousness status unknown, initial encounter: S06.9XAA

## 2024-02-01 HISTORY — DX: Unspecified convulsions: R56.9

## 2024-02-01 LAB — BASIC METABOLIC PANEL WITH GFR
Anion gap: 16 — ABNORMAL HIGH (ref 5–15)
BUN: 12 mg/dL (ref 6–20)
CO2: 22 mmol/L (ref 22–32)
Calcium: 8.4 mg/dL — ABNORMAL LOW (ref 8.9–10.3)
Chloride: 104 mmol/L (ref 98–111)
Creatinine, Ser: 0.55 mg/dL (ref 0.44–1.00)
GFR, Estimated: >60 mL/min (ref >60)
Glucose, Bld: 121 mg/dL — ABNORMAL HIGH (ref 70–99)
Potassium: 2.4 mmol/L — CL (ref 3.5–5.1)
Sodium: 142 mmol/L (ref 135–145)

## 2024-02-01 LAB — CBC
HCT: 44.1 % (ref 36.0–46.0)
Hemoglobin: 14.9 g/dL (ref 12.0–15.0)
MCH: 28.4 pg (ref 26.0–34.0)
MCHC: 33.8 g/dL (ref 30.0–36.0)
MCV: 84.2 fL (ref 80.0–100.0)
Platelets: 370 K/uL (ref 150–400)
RBC: 5.24 MIL/uL — ABNORMAL HIGH (ref 3.87–5.11)
RDW: 13 % (ref 11.5–15.5)
WBC: 10.1 K/uL (ref 4.0–10.5)
nRBC: 0 % (ref 0.0–0.2)

## 2024-02-01 LAB — VALPROIC ACID LEVEL: Valproic Acid Lvl: 17 ug/mL — ABNORMAL LOW (ref 50–100)

## 2024-02-01 MED ORDER — SODIUM CHLORIDE 0.9 % IV BOLUS
1000.0000 mL | Freq: Once | INTRAVENOUS | Status: AC
  Administered 2024-02-01: 1000 mL via INTRAVENOUS

## 2024-02-01 MED ORDER — POTASSIUM CHLORIDE 10 MEQ/100ML IV SOLN
10.0000 meq | Freq: Once | INTRAVENOUS | Status: AC
  Administered 2024-02-01: 10 meq via INTRAVENOUS
  Filled 2024-02-01: qty 100

## 2024-02-01 MED ORDER — MIDAZOLAM HCL 2 MG/2ML IJ SOLN
2.0000 mg | Freq: Once | INTRAMUSCULAR | Status: AC
  Administered 2024-02-01: 2 mg via INTRAVENOUS
  Filled 2024-02-01: qty 2

## 2024-02-01 MED ORDER — OXYCODONE-ACETAMINOPHEN 5-325 MG PO TABS
1.0000 | ORAL_TABLET | Freq: Once | ORAL | Status: AC
  Administered 2024-02-01: 1 via ORAL
  Filled 2024-02-01: qty 1

## 2024-02-01 MED ORDER — ALPRAZOLAM 1 MG PO TABS
1.0000 mg | ORAL_TABLET | Freq: Three times a day (TID) | ORAL | 0 refills | Status: AC
Start: 2024-02-01 — End: 2024-02-08

## 2024-02-01 MED ORDER — POTASSIUM CHLORIDE CRYS ER 20 MEQ PO TBCR
40.0000 meq | EXTENDED_RELEASE_TABLET | Freq: Once | ORAL | Status: AC
  Administered 2024-02-01: 40 meq via ORAL
  Filled 2024-02-01: qty 2

## 2024-02-01 MED ORDER — POTASSIUM CHLORIDE CRYS ER 20 MEQ PO TBCR: 20.0000 meq | EXTENDED_RELEASE_TABLET | Freq: Two times a day (BID) | ORAL | 0 refills | Status: AC

## 2024-02-01 NOTE — ED Triage Notes (Signed)
 Pt to ED via [facility] from home for seizures. Pt had 3 witnessed seizures by family, 1 witnessed seizure with EMS, EMS reports full body jerking. Pt does take Depakote, she has missed doses. Pt has been taking Xanax  1 mg 3 times daily since 2012 but she has been out of this for a few days due to her doctor leaving. Pt was given Versed  2 mg IV in route. Pt is A [facility] at this time.

## 2024-02-01 NOTE — ED Notes (Signed)
 Critical potassium result communicated face to face to EDP Paduchowski.

## 2024-02-01 NOTE — Discharge Instructions (Addendum)
 Please take your potassium supplements as prescribed.  Please follow-up with your doctor at the end of this week Thursday or Friday for recheck of your labs including potassium level.  You may take your alprazolam  as written, do not drink alcohol or drive while taking this medication.  Please follow-up with your doctor this week to discuss whether or not the alprazolam  should be continued.  We will not be able to write any additional alprazolam /Xanax  prescriptions from the emergency department.

## 2024-02-01 NOTE — ED Provider Notes (Signed)
 [facility] Provider Note    Event Date/Time   First MD Initiated Contact with Patient 02/01/24 1115     (approximate)  History   Chief Complaint: Seizures  HPI  AKILI CUDA is a 38 y.o. female with a past medical history of ADHD, anxiety, gastric reflux, TBI, seizures/seizure-like activity, presents to the emergency department for seizures.  According to the patient per report patient has been taken off of Xanax  recently has not had Xanax  for a couple days per patient.  This morning reports 4 seizure-like activity events patient did bite her tongue.  Patient states she had a fall yesterday during a seizure and hit her head and has mild neck pain and head pain.  Reported history of chronic neck pain as well.  Patient is awake alert answering questions appropriately.  Physical Exam   Triage Vital Signs: ED Triage Vitals  Encounter Vitals Group     BP 02/01/24 1112 (!) 152/119     Girls Systolic BP Percentile --      Girls Diastolic BP Percentile --      Boys Systolic BP Percentile --      Boys Diastolic BP Percentile --      Pulse Rate 02/01/24 1109 (!) 111     Resp 02/01/24 1109 16     Temp 02/01/24 1109 98.6 F (37 C)     Temp Source 02/01/24 1109 Oral     SpO2 02/01/24 1109 100 %     Weight 02/01/24 1107 177 lb (80.3 kg)     Height 02/01/24 1107 5' 5 (1.651 m)     Head Circumference --      Peak Flow --      Pain Score 02/01/24 1107 8     Pain Loc --      Pain Education --      Exclude from Growth Chart --     Most recent vital signs: Vitals:   02/01/24 1109 02/01/24 1112  BP:  (!) 152/119  Pulse: (!) 111   Resp: 16   Temp: 98.6 F (37 C)   SpO2: 100%     General: Awake, no distress.  Small tongue abrasion CV:  Good peripheral perfusion.  Regular rate and rhythm  Resp:  Normal effort.  Equal breath sounds bilaterally.  Abd:  No distention.  Soft, nontender.  ED Results / Procedures / Treatments   RADIOLOGY  I have  reviewed interpret the CT head images.  No obvious bleed seen on my evaluation. Radiology is read the CT scan is negative. CT cervical spine is negative.  MEDICATIONS ORDERED IN ED: Medications  midazolam  (VERSED ) injection 2 mg (has no administration in time range)  sodium chloride  0.9 % bolus 1,000 mL (has no administration in time range)     IMPRESSION / MDM / ASSESSMENT AND PLAN / ED COURSE  I reviewed the triage vital signs and the nursing notes.  Patient's presentation is most consistent with acute presentation with potential threat to life or bodily function.  Patient presents to the emergency department after multiple witnessed seizures this morning.  I reviewed the patient's chart from Dr. Lane, patient is on multiple medications including Depakote Seroquel Lyrica .  Patient was on Xanax  but reportedly has not had any in a couple days I do not see any notes addressing why the Xanax  was discontinued.  In reading Dr. Clement notes and the EEGs it appears that a lot of the patient's seizure activity is not true  epileptic seizures however as she has been on long-term benzodiazepines and has a tongue abrasion there is a possibility that she could have had a withdrawal seizure this morning.  We will check labs including a valproic acid  level.  We will dose IV benzodiazepines fluids and obtain CT imaging of the head and C-spine given her reported fall yesterday and headache/neck pain today.  Will continue to closely monitor while awaiting results.  Patient's labs have resulted showing a reassuring CBC.  Valproic acid  level is very low possibly indicating more noncompliance.  Patient's chemistry does show a low potassium otherwise no significant findings besides an elevated anion gap of 16.  Patient has received fluids.  She has received IV and oral potassium.  Patient states a history of low potassium in the past has been on supplements previously.  Given the patient's low potassium I did offer  admission to the hospital for further workup and treatment.  Patient states she has had a low potassium in the past and would prefer to be discharged home.  Will discharge on potassium supplements I will prescribe a short course of Xanax  at the patient's prior dose of 1 mg 3 times daily for the next 1 week until the patient can see her PCP to discuss this medication further to see if this medication needs to be discontinued or continued.  Patient agreeable to plan of care.  FINAL CLINICAL IMPRESSION(S) / ED DIAGNOSES   Seizure-like activity Fall Hypokalemia   Note:  This document was prepared using Dragon voice recognition software and may include unintentional dictation errors.   Dorothyann Drivers, MD 02/01/24 [phone]

## 2024-02-05 NOTE — Progress Notes (Addendum)
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 clinical information in the electronic or paper health record, independently interpreting test or lab results and communicating these results to the family or caregiver, and coordinating care (when not reported separately).   Collaboration of Care: Other reviewed notes in Epic  Patient/Guardian was advised Release of Information must be obtained prior to any record release in order to collaborate their care with an outside provider. Patient/Guardian was advised if they have not already done so to contact the registration department to sign all necessary forms in order for us  to release information regarding their care.   Consent: Patient/Guardian gives verbal consent for treatment and assignment of benefits for services provided during this visit. Patient/Guardian expressed understanding and agreed to proceed.   Katheren Sleet,  MD 9/11/202512:51 PM

## 2024-02-11 ENCOUNTER — Ambulatory Visit: Admitting: Psychiatry

## 2024-02-11 ENCOUNTER — Other Ambulatory Visit: Payer: Self-pay

## 2024-02-11 ENCOUNTER — Encounter: Payer: Self-pay | Admitting: Psychiatry

## 2024-02-11 VITALS — BP 143/96 | HR 127 | Temp 98.2°F | Ht 65.0 in | Wt 172.2 lb

## 2024-02-11 DIAGNOSIS — Z79899 Other long term (current) drug therapy: Secondary | ICD-10-CM | POA: Diagnosis not present

## 2024-02-11 DIAGNOSIS — F331 Major depressive disorder, recurrent, moderate: Secondary | ICD-10-CM | POA: Diagnosis not present

## 2024-02-11 DIAGNOSIS — F4310 Post-traumatic stress disorder, unspecified: Secondary | ICD-10-CM

## 2024-02-11 MED ORDER — ALPRAZOLAM 2 MG PO TABS: ORAL_TABLET | ORAL | 0 refills | Status: AC

## 2024-02-11 MED ORDER — SERTRALINE HCL 50 MG PO TABS: ORAL_TABLET | ORAL | 1 refills | Status: DC

## 2024-02-11 MED ORDER — PRAZOSIN HCL 1 MG PO CAPS: 1.0000 mg | ORAL_CAPSULE | Freq: Every day | ORAL | 1 refills | Status: DC

## 2024-02-11 NOTE — Patient Instructions (Signed)
 Start sertraline  25 mg at night for one week, then 50 mg daily  Decrease duloxetine 20 mg daily for one week, then discontinue  Start prazosin  1 mg at night  Decrease Xanax  2 mg- 2 mg- 1 mg daily as needed for anxiety Hold Adderall  Obtain labs (UDS, TSH) Referral to therapy  Next appointment- 11/6 10:30

## 2024-02-11 NOTE — Addendum Note (Signed)
 Addended by: Rossi Silvestro on: 02/11/2024 12:52 PM   Modules accepted: Level of Service

## 2024-03-04 ENCOUNTER — Other Ambulatory Visit: Payer: Self-pay | Admitting: Psychiatry
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
## 2024-04-07 ENCOUNTER — Ambulatory Visit: Admitting: Psychiatry
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
## 2024-05-17 ENCOUNTER — Other Ambulatory Visit: Payer: Self-pay | Admitting: Psychiatry

## 2024-05-17 ENCOUNTER — Encounter: Payer: Self-pay | Admitting: Psychiatry

## 2024-05-17 ENCOUNTER — Ambulatory Visit: Admitting: Psychiatry

## 2024-05-17 ENCOUNTER — Other Ambulatory Visit
Admission: RE | Admit: 2024-05-17 | Discharge: 2024-05-17 | Disposition: A | Attending: Psychiatry | Admitting: Psychiatry

## 2024-05-17 ENCOUNTER — Other Ambulatory Visit: Payer: Self-pay

## 2024-05-17 VITALS — BP 135/94 | HR 128 | Temp 97.5°F | Ht 65.0 in | Wt 181.6 lb

## 2024-05-17 DIAGNOSIS — F4310 Post-traumatic stress disorder, unspecified: Secondary | ICD-10-CM | POA: Diagnosis not present

## 2024-05-17 DIAGNOSIS — Z79899 Other long term (current) drug therapy: Secondary | ICD-10-CM

## 2024-05-17 DIAGNOSIS — F331 Major depressive disorder, recurrent, moderate: Secondary | ICD-10-CM

## 2024-05-17 LAB — TSH: TSH: 4.37 u[IU]/mL (ref 0.350–4.500)

## 2024-05-17 MED ORDER — ALPRAZOLAM 1 MG PO TABS: 1.0000 mg | ORAL_TABLET | Freq: Three times a day (TID) | ORAL | 0 refills | Status: DC | PRN

## 2024-05-17 MED ORDER — FLUOXETINE HCL 40 MG PO CAPS: 40.0000 mg | ORAL_CAPSULE | Freq: Every day | ORAL | 1 refills | Status: DC

## 2024-05-17 MED ORDER — PRAZOSIN HCL 1 MG PO CAPS: 1.0000 mg | ORAL_CAPSULE | Freq: Every day | ORAL | 1 refills | Status: AC

## 2024-05-17 MED ORDER — FLUOXETINE HCL 20 MG PO CAPS: 20.0000 mg | ORAL_CAPSULE | Freq: Every day | ORAL | 0 refills | Status: DC

## 2024-05-17 NOTE — Patient Instructions (Addendum)
 Start fluoxetine  20 mg daily for one week, then 40 mg daily  Decrease sertraline  25 mg at night for one week, then discontinue Continue prazosin  1 mg at night  Restart Xanax  1 mg three times a day as needed for anxiety  Hold Adderall  Obtain labs (UDS, TSH) Next appointment- 2/5 at 10:30

## 2024-05-18 ENCOUNTER — Ambulatory Visit: Payer: Self-pay | Admitting: Psychiatry

## 2024-05-18 LAB — URINE DRUGS OF ABUSE SCREEN W ALC, ROUTINE (REF LAB)
Amphetamines, Urine: NEGATIVE ng/mL
Barbiturate, Ur: NEGATIVE ng/mL
Benzodiazepine Quant, Ur: NEGATIVE ng/mL
Cannabinoid Quant, Ur: NEGATIVE ng/mL
Cocaine (Metab.): NEGATIVE ng/mL
Creatinine, Urine: 184.5 mg/dL (ref 20.0–300.0)
Ethanol U, Quan: NEGATIVE %
Methadone Screen, Urine: NEGATIVE ng/mL
Nitrite Urine, Quantitative: NEGATIVE ug/mL
OPIATE SCREEN URINE: NEGATIVE ng/mL
Phencyclidine, Ur: NEGATIVE ng/mL
Propoxyphene, Urine: NEGATIVE ng/mL
pH, Urine: 5.5 (ref 4.5–8.9)

## 2024-05-19 ENCOUNTER — Other Ambulatory Visit: Payer: Self-pay | Admitting: Psychiatry

## 2024-05-19 MED ORDER — ALPRAZOLAM 1 MG PO TABS: 1.0000 mg | ORAL_TABLET | Freq: Three times a day (TID) | ORAL | 0 refills | Status: AC | PRN

## 2024-06-08 ENCOUNTER — Other Ambulatory Visit: Payer: Self-pay | Admitting: Psychiatry

## 2024-07-07 ENCOUNTER — Encounter: Payer: Self-pay | Admitting: Psychiatry

## 2024-07-07 ENCOUNTER — Ambulatory Visit: Admitting: Psychiatry

## 2024-07-07 ENCOUNTER — Other Ambulatory Visit: Payer: Self-pay

## 2024-07-07 VITALS — BP 121/89 | HR 102 | Temp 97.8°F | Ht 65.0 in | Wt 172.8 lb

## 2024-07-07 DIAGNOSIS — F4310 Post-traumatic stress disorder, unspecified: Secondary | ICD-10-CM | POA: Diagnosis not present

## 2024-07-07 DIAGNOSIS — F410 Panic disorder [episodic paroxysmal anxiety] without agoraphobia: Secondary | ICD-10-CM | POA: Diagnosis not present

## 2024-07-07 DIAGNOSIS — Z79899 Other long term (current) drug therapy: Secondary | ICD-10-CM

## 2024-07-07 DIAGNOSIS — F331 Major depressive disorder, recurrent, moderate: Secondary | ICD-10-CM

## 2024-07-07 MED ORDER — FLUOXETINE HCL 40 MG PO CAPS: 40.0000 mg | ORAL_CAPSULE | Freq: Every day | ORAL | 1 refills | Status: AC

## 2024-07-07 MED ORDER — FLUOXETINE HCL 20 MG PO CAPS: 20.0000 mg | ORAL_CAPSULE | Freq: Every day | ORAL | 2 refills | Status: AC

## 2024-07-07 NOTE — Patient Instructions (Signed)
 Increase fluoxetine  60 mg daily  Continue prazosin  1 mg at night  Continue Xanax  1 mg three times a day as needed for anxiety  Referred to therapy onsite Next appointment- 4/14 at 10:30

## 2024-09-13 ENCOUNTER — Ambulatory Visit: Admitting: Psychiatry
